# Patient Record
Sex: Female | Born: 1970 | Race: White | Hispanic: No | Smoking: Never smoker
Health system: Southern US, Community
[De-identification: ages and names within clinical notes are randomized; demographics above are authoritative.]

## PROBLEM LIST (undated history)

## (undated) DIAGNOSIS — E039 Hypothyroidism, unspecified: Secondary | ICD-10-CM

## (undated) DIAGNOSIS — C801 Malignant (primary) neoplasm, unspecified: Secondary | ICD-10-CM

## (undated) DIAGNOSIS — E079 Disorder of thyroid, unspecified: Secondary | ICD-10-CM

## (undated) DIAGNOSIS — I10 Essential (primary) hypertension: Secondary | ICD-10-CM

## (undated) HISTORY — DX: Hypothyroidism, unspecified: E03.9

## (undated) HISTORY — PX: COLONOSCOPY: SHX174

## (undated) HISTORY — PX: FINE NEEDLE ASPIRATION: SHX406

## (undated) HISTORY — DX: Disorder of thyroid, unspecified: E07.9

## (undated) HISTORY — DX: Essential (primary) hypertension: I10

---
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
## 2005-09-04 ENCOUNTER — Ambulatory Visit: Payer: Self-pay

## 2007-08-01 ENCOUNTER — Observation Stay: Payer: Self-pay | Admitting: Obstetrics and Gynecology

## 2007-08-25 ENCOUNTER — Inpatient Hospital Stay: Payer: Self-pay

## 2011-07-01 ENCOUNTER — Ambulatory Visit: Payer: Self-pay

## 2012-08-04 ENCOUNTER — Ambulatory Visit: Payer: Self-pay

## 2012-08-17 ENCOUNTER — Ambulatory Visit: Payer: Self-pay

## 2013-08-31 ENCOUNTER — Ambulatory Visit: Payer: Self-pay

## 2013-09-05 ENCOUNTER — Ambulatory Visit: Payer: Self-pay

## 2013-09-07 ENCOUNTER — Ambulatory Visit: Payer: Self-pay

## 2013-09-08 LAB — PATHOLOGY REPORT

## 2013-09-21 ENCOUNTER — Encounter: Payer: Self-pay | Admitting: General Surgery

## 2013-10-18 ENCOUNTER — Encounter: Payer: Self-pay | Admitting: General Surgery

## 2013-10-18 ENCOUNTER — Other Ambulatory Visit: Payer: Self-pay | Admitting: General Surgery

## 2013-10-18 ENCOUNTER — Other Ambulatory Visit: Payer: BC Managed Care – PPO

## 2013-10-18 ENCOUNTER — Ambulatory Visit (INDEPENDENT_AMBULATORY_CARE_PROVIDER_SITE_OTHER): Payer: BC Managed Care – PPO | Admitting: General Surgery

## 2013-10-18 VITALS — BP 124/72 | HR 78 | Resp 12 | Ht 68.0 in | Wt 176.0 lb

## 2013-10-18 DIAGNOSIS — R928 Other abnormal and inconclusive findings on diagnostic imaging of breast: Secondary | ICD-10-CM

## 2013-10-18 DIAGNOSIS — N63 Unspecified lump in unspecified breast: Secondary | ICD-10-CM

## 2013-10-18 NOTE — Patient Instructions (Addendum)
Patient to be scheduled for excision of left breast. The patient is aware to call back for any questions or concerns.  Patient is scheduled for surgery at [facility] on 10/31/13. She will pre admit by phone on 10/20/13. Patient is aware of date and instructions.

## 2013-10-18 NOTE — Progress Notes (Signed)
Patient ID: Yolanda Gill, female   DOB: November 13, 1970, 43 y.o.   MRN: [id number]  Chief Complaint  Patient presents with  . Other    mammogram    HPI Yolanda Gill is a 43 y.o. female. who presents for a breast evaluation. The patient underwent screening mammograms on August 31, 2013. Some distortion on the left oriented additional views. BI-RAD-0. The most recent mammogram and ultrasound  was done on 09/05/13. She also had a left breast biopsy done on 09/07/13. Patient does perform regular self breast checks and gets regular mammograms done. No known family history of breast problems. The patient denies any problems with the breasts at this time.  She had additional views and ultrasound last year on the right breast. No injuries to the breasts.    HPI  Past Medical History  Diagnosis Date  . Thyroid disease   . Hypothyroidism     Past Surgical History  Procedure Laterality Date  . Wisdom tooth extraction  1990  . Fine needle aspiration Left     when she was in college - left breast    No family history on file.  Social History History  Substance Use Topics  . Smoking status: Never Smoker   . Smokeless tobacco: Never Used  . Alcohol Use: No    No Known Allergies  Current Outpatient Prescriptions  Medication Sig Dispense Refill  . fluticasone (FLONASE) 50 MCG/ACT nasal spray Place 1 spray into both nostrils daily.      Marland Kitchen levothyroxine (SYNTHROID, LEVOTHROID) 75 MCG tablet Take 1 tablet by mouth daily.       No current facility-administered medications for this visit.    Review of Systems Review of Systems  Constitutional: Negative.   Respiratory: Negative.   Cardiovascular: Negative.     Blood pressure 124/72, pulse 78, resp. rate 12, height 5\' 8"  (1.727 m), weight 176 lb (79.833 kg), last menstrual period 10/03/2013.  Physical Exam Physical Exam  Constitutional: She is oriented to person, place, and time. She appears well-developed and well-nourished.  Neck: Neck supple.  No thyromegaly present.  Cardiovascular: Normal rate, regular rhythm and normal heart sounds.   No murmur heard. Pulmonary/Chest: Effort normal and breath sounds normal. Right breast exhibits no inverted nipple, no mass, no nipple discharge, no skin change and no tenderness. Left breast exhibits no inverted nipple, no mass, no nipple discharge, no skin change and no tenderness.  Lymphadenopathy:    She has no cervical adenopathy.    She has no axillary adenopathy.  Neurological: She is alert and oriented to person, place, and time.  Skin: Skin is warm and dry.    Data Reviewed Additional views dated Sep 05, 2013 showed heterogeneously dense tissue. Distortion in the upper inner quadrant left breast was reported. Ultrasound suggested a solitary distortion 4 cm from the nipple with associated acoustic shadowing.. Core biopsy completed Sep 07, 2013 was completed by the radiology service. A 12-gauge vacuum device was used and 3 samples obtained. Postbiopsy mammogram reported that the biopsy marker was 2 cm medial and anterior to the area of distortion that was the area of concern.   Pathology dated Sep 07, 2013 showed features consistent with radial scar. No atypia or malignancy. Each of 3 cores reportedly contained areas of elastosis, fibrosis and entrapped epithelial elements. These areas measured 2, 2 and 4 mm in diameter. Columnar cell change in clusters of apocrine microcysts were also identified.  Pre-and post biopsy mammograms were reviewed and compared to prior studies.  Ultrasound examination of the left breast in the upper inner quadrant were completed. Images are incorrectly labeled as right. At the 11 o'clock position of the left breast, 4 cm from nipple an area of architectural distortion and a clip are identified measuring 0.8 x 0.96 x 0.97 cm. This correlates with a postbiopsy film showing a rounded density medial to the marked area of radiologic concern.  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     Plan    There is unfortunately a discordance between where the biopsy was reportedly completed, where postbiopsy changes are seen on the post ultrasound-guided mammograms and clip placement and the microscopic findings the radial scar. Radial scars less than 10 mm in diameter required no additional surgical intervention. As it's unclear of what area and the breast was actually biopsied, options were reviewed: 1) open excision of the area versus 2) 6 month followup mammograms for 2 years to confirm stability. Considering the distortion evidence on the postbiopsy mammograms it may be difficult to "sort the wheat from the chaff".   The risks of open biopsy were discussed. Bleeding, infection, pain and distortion of the breast and we'll were reviewed. At this time it seems prudent to proceed to excision.    Patient is scheduled for surgery at [facility] on 10/31/13. She will pre admit by phone on 10/20/13. Patient is aware of date and instructions.  PCP and Ref MD: Eddie Dibbles 10/18/2013, 7:30 PM

## 2013-10-19 ENCOUNTER — Telehealth: Payer: Self-pay

## 2013-10-19 NOTE — Telephone Encounter (Signed)
Spoke with patient about upcoming surgery on 10/31/13 and let her know she would also be getting a needle wire location done in mammography. Patient is to report to mammography at [facility] on 10/31/13 at 8:15 am. Patient is aware of date, time, and instructions.

## 2013-10-20 ENCOUNTER — Ambulatory Visit: Payer: Self-pay | Admitting: General Surgery

## 2013-10-31 ENCOUNTER — Encounter: Payer: Self-pay | Admitting: General Surgery

## 2013-10-31 ENCOUNTER — Ambulatory Visit: Payer: Self-pay | Admitting: General Surgery

## 2013-10-31 DIAGNOSIS — D249 Benign neoplasm of unspecified breast: Secondary | ICD-10-CM

## 2013-10-31 HISTORY — PX: BREAST SURGERY: SHX581

## 2013-11-01 ENCOUNTER — Encounter: Payer: Self-pay | Admitting: General Surgery

## 2013-11-02 ENCOUNTER — Telehealth: Payer: Self-pay | Admitting: *Deleted

## 2013-11-02 LAB — PATHOLOGY REPORT

## 2013-11-02 NOTE — Telephone Encounter (Signed)
Notified patient as instructed, benign breast biopsy results, patient pleased. Dr. Bary Castilla will go into more details at her office visit. She states she is doing well. Discussed follow-up appointments, patient agrees.

## 2013-11-04 ENCOUNTER — Encounter: Payer: Self-pay | Admitting: General Surgery

## 2013-11-07 ENCOUNTER — Encounter: Payer: Self-pay | Admitting: General Surgery

## 2013-11-07 ENCOUNTER — Ambulatory Visit (INDEPENDENT_AMBULATORY_CARE_PROVIDER_SITE_OTHER): Payer: Self-pay | Admitting: General Surgery

## 2013-11-07 VITALS — BP 120/78 | HR 72 | Resp 14 | Ht 68.0 in | Wt 176.0 lb

## 2013-11-07 DIAGNOSIS — N63 Unspecified lump in unspecified breast: Secondary | ICD-10-CM

## 2013-11-07 DIAGNOSIS — N6489 Other specified disorders of breast: Secondary | ICD-10-CM | POA: Insufficient documentation

## 2013-11-07 NOTE — Progress Notes (Signed)
Patient ID: Yolanda Gill, female   DOB: [birth year], 43 y.o.   MRN: [id number]  Chief Complaint  Patient presents with  . Follow-up    post op left breast excision     HPI Yolanda Gill is a 43 y.o. female who presents for a post op excision of left breast mass. The procedure was performed on 10/31/13. Needle localization was completed to confirm the area of mammographic abnormality was included in the reexcision of the radial scar identified on ultrasound guided biopsy.  No new complaints at this time.   HPI  Past Medical History  Diagnosis Date  . Thyroid disease   . Hypothyroidism     Past Surgical History  Procedure Laterality Date  . Wisdom tooth extraction  1990  . Fine needle aspiration Left     when she was in college - left breast  . Breast surgery Left October 31, 2013    Radial scar without atypia    No family history on file.  Social History History  Substance Use Topics  . Smoking status: Never Smoker   . Smokeless tobacco: Never Used  . Alcohol Use: No    No Known Allergies  Current Outpatient Prescriptions  Medication Sig Dispense Refill  . fluticasone (FLONASE) 50 MCG/ACT nasal spray Place 1 spray into both nostrils daily.      Marland Kitchen levothyroxine (SYNTHROID, LEVOTHROID) 75 MCG tablet Take 1 tablet by mouth daily.       No current facility-administered medications for this visit.    Review of Systems Review of Systems  Constitutional: Negative.   Respiratory: Negative.   Cardiovascular: Negative.     Blood pressure 120/78, pulse 72, resp. rate 14, height 5\' 8"  (1.727 m), weight 176 lb (79.833 kg), last menstrual period 10/24/2013.  Physical Exam Physical Exam  Constitutional: She is oriented to person, place, and time. She appears well-developed and well-nourished.  Pulmonary/Chest:    Left breast excision healing well.   Neurological: She is alert and oriented to person, place, and time.  Skin: Skin is warm and dry.    Data Reviewed Radial scar  without atypia  Assessment    Benign breast exam post excision.     Plan    The patient expressed an interest in having her followup mammographic studies completed at another facility. Her which will be accommodated. A left breast mammogram will be obtained in 5 months at [facility].     PCP: Eddie Dibbles 11/07/2013, 10:28 PM

## 2013-11-07 NOTE — Patient Instructions (Signed)
Patient to return in 5 months with left breast mammogram. Continue self breast exams. Call office for any new breast issues or concerns.

## 2014-03-06 ENCOUNTER — Encounter: Payer: Self-pay | Admitting: General Surgery

## 2014-04-12 ENCOUNTER — Ambulatory Visit: Payer: BC Managed Care – PPO | Admitting: General Surgery

## 2014-05-02 ENCOUNTER — Encounter: Payer: Self-pay | Admitting: *Deleted

## 2014-05-09 ENCOUNTER — Encounter: Payer: Self-pay | Admitting: General Surgery

## 2014-08-26 NOTE — Op Note (Signed)
PATIENT NAME:  Yolanda Gill, Yolanda Gill MR#:  [id number] DATE OF BIRTH:  1971/04/26  DATE OF PROCEDURE:  10/31/2013  PREOPERATIVE DIAGNOSIS: Abnormal left breast mammogram.   POSTOPERATIVE DIAGNOSIS: Abnormal left breast mammogram.   OPERATIVE PROCEDURE: Wire localization and excision of left breast mass.   SURGEON: Hervey Ard, MD   ANESTHESIA: General by LMA, Marcaine 0.5% with 1:200,000 units epinephrine 30 mL local infiltration.   CLINICAL NOTE: This 44 year old woman had previously undergone an ultrasound-guided biopsy with evidence of radial scar. The radiologist reported discordance between the mammogram and ultrasound imaging and for that reason the patient was felt to be a candidate for excisional biopsy.   OPERATIVE NOTE: With the patient under adequate general anesthesia, the breast was carefully prepped with ChloraPrep to prevent disturbance of the previously placed localizing wire. The radiologist had been contacted the morning of the procedure to discuss the need to localize the mammographic abnormality and this was accomplished.   Marcaine was infiltrated for postoperative analgesia. An incision was made from the 10 to the 1 o'clock position of the breast, in a curvilinear fashion, and the skin was incised sharply. The remaining dissection was completed with electrocautery. The localizing wire was brought into the field after identification with ultrasound. The previous biopsy site was identified. A 3 x 3 x 3 cm block of tissue was excised, orientated and sent for specimen radiograph. This confirmed the previously placed biopsy clip as well as the entire hook of the wire. This was then sent for routine histology. The wound was closed in layers with 2-0 Vicryl figure-of-eight sutures. The skin was closed with a running 4-0 Vicryl subcuticular suture. Benzoin and Steri-Strips followed by a Telfa dressing were applied. Fluff gauze, Kerlix and an Ace wrap were then applied.   The patient  tolerated the procedure well and was taken to the recovery room in stable condition.  ____________________________ Robert Bellow, MD jwb:sb D: 11/01/2013 08:11:58 ET T: 11/01/2013 08:33:11 ET JOB#: [id number]  cc: Robert Bellow, MD, <Dictator> Flossie Dibble, CNM JEFFREY Amedeo Kinsman MD ELECTRONICALLY SIGNED 11/01/2013 18:49

## 2014-11-07 ENCOUNTER — Encounter: Payer: Self-pay | Admitting: General Surgery

## 2014-11-09 ENCOUNTER — Ambulatory Visit (INDEPENDENT_AMBULATORY_CARE_PROVIDER_SITE_OTHER): Payer: BLUE CROSS/BLUE SHIELD | Admitting: General Surgery

## 2014-11-09 ENCOUNTER — Encounter: Payer: Self-pay | Admitting: General Surgery

## 2014-11-09 VITALS — BP 106/74 | HR 80 | Resp 14 | Ht 68.0 in | Wt 181.0 lb

## 2014-11-09 DIAGNOSIS — N6489 Other specified disorders of breast: Secondary | ICD-10-CM

## 2014-11-09 NOTE — Patient Instructions (Signed)
Continue self breast exams. Call office for any new breast issues or concerns. 

## 2014-11-09 NOTE — Progress Notes (Signed)
Patient ID: Yolanda Gill, female   DOB: 08/14/1970, 44 y.o.   MRN: [id number]  Chief Complaint  Patient presents with  . Follow-up    HPI Yolanda Gill is a 44 y.o. female.  who presents for a breast evaluation. The most recent mammogram was done on 11-03-14.  Patient does perform regular self breast checks and gets regular mammograms done. No new breast complaints.   HPI  Past Medical History  Diagnosis Date  . Thyroid disease   . Hypothyroidism     Past Surgical History  Procedure Laterality Date  . Wisdom tooth extraction  1990  . Fine needle aspiration Left     when she was in college - left breast  . Breast surgery Left October 31, 2013    Radial scar without atypia    No family history on file.  Social History History  Substance Use Topics  . Smoking status: Never Smoker   . Smokeless tobacco: Never Used  . Alcohol Use: No    No Known Allergies  Current Outpatient Prescriptions  Medication Sig Dispense Refill  . fluticasone (FLONASE) 50 MCG/ACT nasal spray Place 1 spray into both nostrils daily.    Marland Kitchen levothyroxine (SYNTHROID, LEVOTHROID) 75 MCG tablet Take 1 tablet by mouth daily.     No current facility-administered medications for this visit.    Review of Systems Review of Systems  Constitutional: Negative.   Respiratory: Negative.   Cardiovascular: Negative.     Blood pressure 106/74, pulse 80, resp. rate 14, height 5\' 8"  (1.727 m), weight 181 lb (82.101 kg), last menstrual period 11/02/2014.  Physical Exam Physical Exam  Constitutional: She is oriented to person, place, and time. She appears well-developed and well-nourished.  HENT:  Mouth/Throat: Oropharynx is clear and moist.  Eyes: Conjunctivae are normal. No scleral icterus.  Neck: Neck supple.  Cardiovascular: Normal rate, regular rhythm and normal heart sounds.   Pulmonary/Chest: Effort normal and breath sounds normal. Right breast exhibits no inverted nipple, no mass, no nipple discharge, no  skin change and no tenderness. Left breast exhibits no inverted nipple, no mass, no nipple discharge, no skin change and no tenderness.  Lymphadenopathy:    She has no cervical adenopathy.    She has no axillary adenopathy.  Neurological: She is alert and oriented to person, place, and time.  Skin: Skin is warm and dry.  Psychiatric: She has a normal mood and affect.    Data Reviewed Bilateral diagnostic mammograms dated 11/03/2014 showed postsurgical changes in the left breast.  BI-RADS-2.  Assessment    Benign breast exam.    Plan    Final follow-up in one year with bilateral mammogram.    Patient will be asked to return to the office in one year with a bilateral screening mammogram. Continue self breast exams. Call office for any new breast issues or concerns.  The patient reports that she was looking to change GYN provider. She was encouraged to touch base with the staff at [facility].  PCP:  No Pcp Per Patient  Carson Myrtle 11/09/2014, 10:46 AM

## 2015-11-08 ENCOUNTER — Encounter: Payer: Self-pay | Admitting: General Surgery

## 2015-11-15 ENCOUNTER — Ambulatory Visit: Payer: BLUE CROSS/BLUE SHIELD | Admitting: General Surgery

## 2015-11-30 ENCOUNTER — Encounter: Payer: Self-pay | Admitting: General Surgery

## 2015-12-11 ENCOUNTER — Encounter: Payer: Self-pay | Admitting: *Deleted

## 2015-12-18 ENCOUNTER — Ambulatory Visit (INDEPENDENT_AMBULATORY_CARE_PROVIDER_SITE_OTHER): Payer: BLUE CROSS/BLUE SHIELD | Admitting: General Surgery

## 2015-12-18 ENCOUNTER — Encounter: Payer: Self-pay | Admitting: General Surgery

## 2015-12-18 VITALS — BP 124/68 | HR 81 | Resp 12 | Ht 68.0 in | Wt 189.0 lb

## 2015-12-18 DIAGNOSIS — N6489 Other specified disorders of breast: Secondary | ICD-10-CM | POA: Diagnosis not present

## 2015-12-18 NOTE — Patient Instructions (Addendum)
Continue self breast exams. Call office for any new breast issues or concerns. 

## 2015-12-18 NOTE — Progress Notes (Addendum)
Patient ID: Yolanda Gill, female   DOB: 1970-06-25, 45 y.o.   MRN: [id number]  Chief Complaint  Patient presents with  . Follow-up    mammogram    HPI Yolanda Gill is a 45 y.o. female.  who presents for a breast evaluation. The most recent mammogram was done on 11-07-15 with left mammogram 11-28-15.  Patient does perform regular self breast checks and gets regular mammograms done. No new breast changes. Patient reports her Maternal Aunt was diagnosed with breast cancer recently.     HPI  Past Medical History:  Diagnosis Date  . Hypothyroidism   . Thyroid disease     Past Surgical History:  Procedure Laterality Date  . BREAST SURGERY Left October 31, 2013   Radial scar without atypia  . FINE NEEDLE ASPIRATION Left    when she was in college - left breast  . WISDOM TOOTH EXTRACTION  1990    Family History  Problem Relation Age of Onset  . Cancer Maternal Aunt     Breast Cancer     Social History Social History  Substance Use Topics  . Smoking status: Never Smoker  . Smokeless tobacco: Never Used  . Alcohol use No    No Known Allergies  Current Outpatient Prescriptions  Medication Sig Dispense Refill  . levothyroxine (SYNTHROID, LEVOTHROID) 75 MCG tablet Take 1 tablet by mouth daily.     No current facility-administered medications for this visit.     Review of Systems Review of Systems  Constitutional: Negative.   Respiratory: Negative.   Cardiovascular: Negative.     Blood pressure 124/68, pulse 81, resp. rate 12, height 5\' 8"  (1.727 m), weight 189 lb (85.7 kg).  Physical Exam Physical Exam  Constitutional: She is oriented to person, place, and time. She appears well-developed and well-nourished.  Eyes: Conjunctivae are normal. No scleral icterus.  Neck: Neck supple.  Cardiovascular: Normal rate, regular rhythm and normal heart sounds.   Pulmonary/Chest: Effort normal and breath sounds normal. Right breast exhibits no inverted nipple, no mass, no nipple  discharge, no skin change and no tenderness. Left breast exhibits no inverted nipple, no mass, no nipple discharge, no skin change and no tenderness.    Lymphadenopathy:    She has no cervical adenopathy.    She has no axillary adenopathy.  Neurological: She is alert and oriented to person, place, and time.  Skin: Skin is warm and dry.    Data Reviewed Lateral screening mammograms dated 11/07/2015 completed at [facility] reviewed. Mild architectural distortion in the area of previous excision. BIRAD-0.  Focal spot compression views dated like 28, 2017 were unremarkable. BI-RADS-1.  Assessment    Unremarkable breast exam.    Plan    Patient to follow up with primary doctor to have routine mammograms yearly.        PCP: Dewain Penning 12/19/2015, 8:08 PM

## 2016-03-19 IMAGING — US US BIOPSY BREAST CORE W/ IMAGING
1 series · 13 of 14 positions shown · non-contrast
Comparison: Previous exams.

ADDENDUM:
The final pathological diagnosis is radial scar. This is concordant
with the imaging findings. Due to the association of radial scar
with malignancy, surgical excision is recommended.

The final pathological diagnosis and recommendation were discussed
with the patient by telephone on 09/09/2013. Her questions were
answered. She reported two quarter sized areas of bruising at the
biopsy site with no pain or palpable hematoma.
CLINICAL DATA: Patient presents for ultrasound-guided core biopsy
of distortion in the left breast.
EXAM:
ULTRASOUND GUIDED LEFT BREAST CORE NEEDLE BIOPSY WITH VACUUM ASSIST

[Series 1: us biopsy breast core w/ imaging · 0.08mm/px · 13 of 14 slices shown]
[im 1/14]
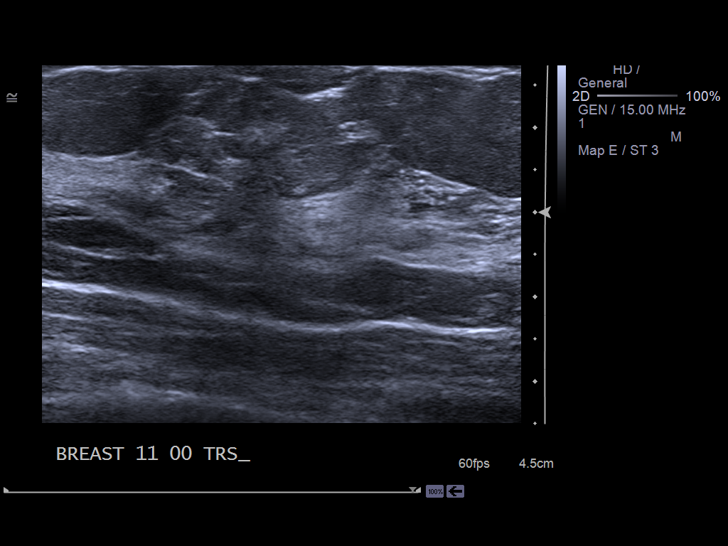
[im 2/14]
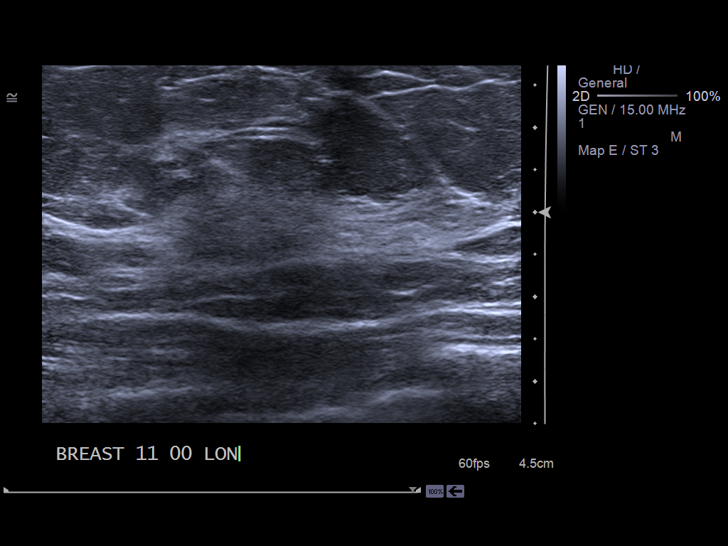
[im 3/14]
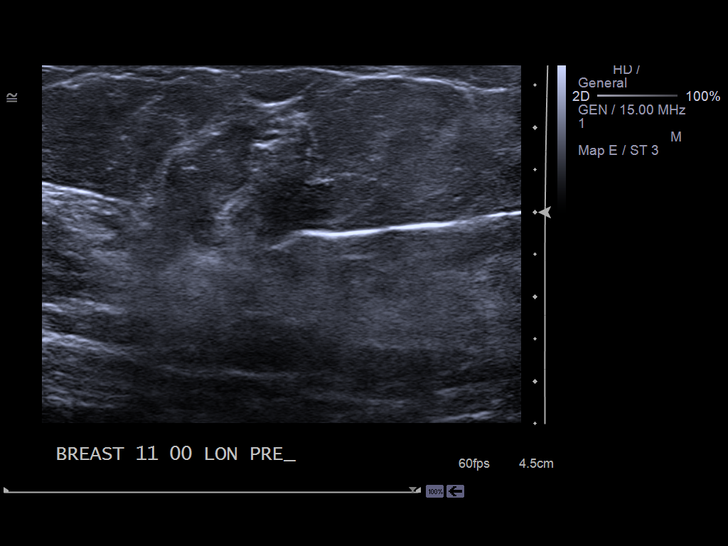
[im 4/14]
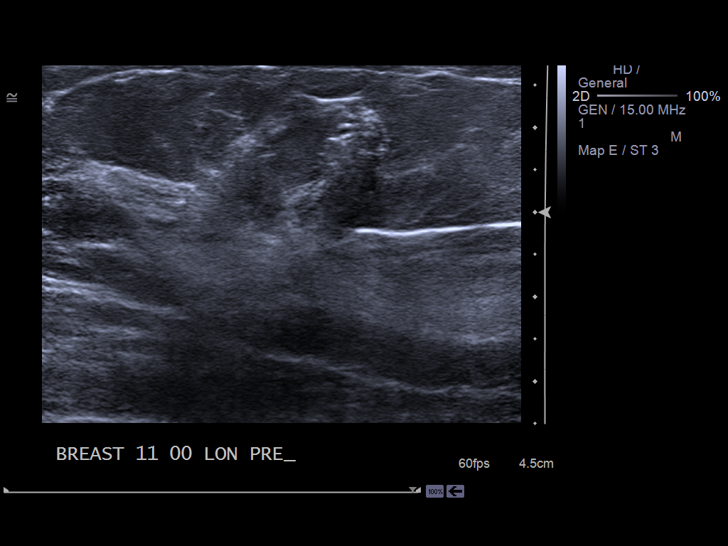
[im 5/14]
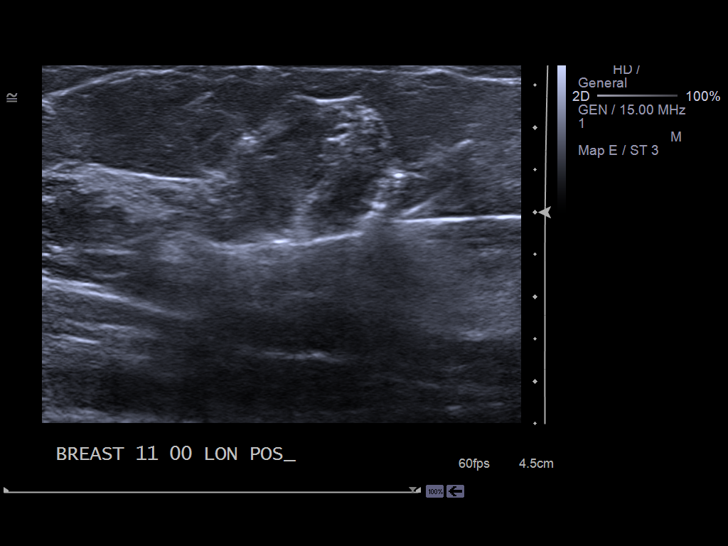
[im 6/14]
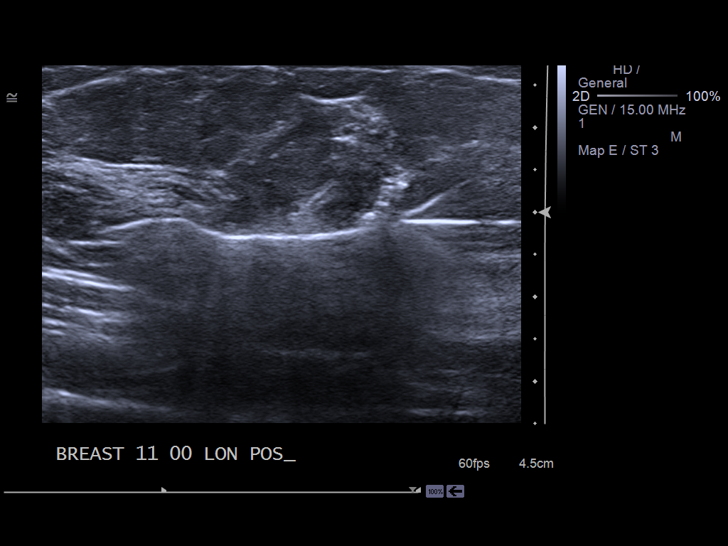
[im 8/14]
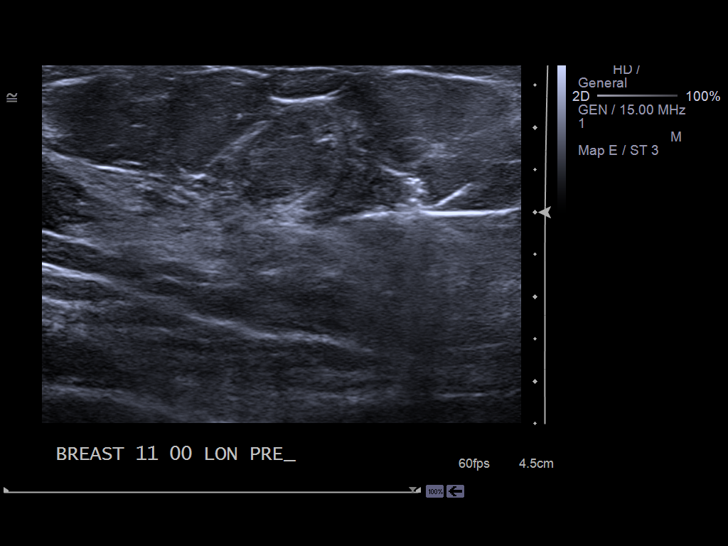
[im 9/14]
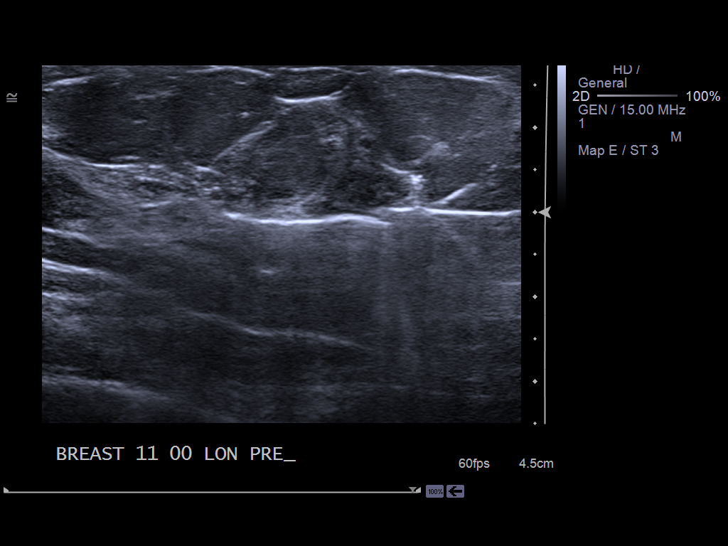
[im 10/14]
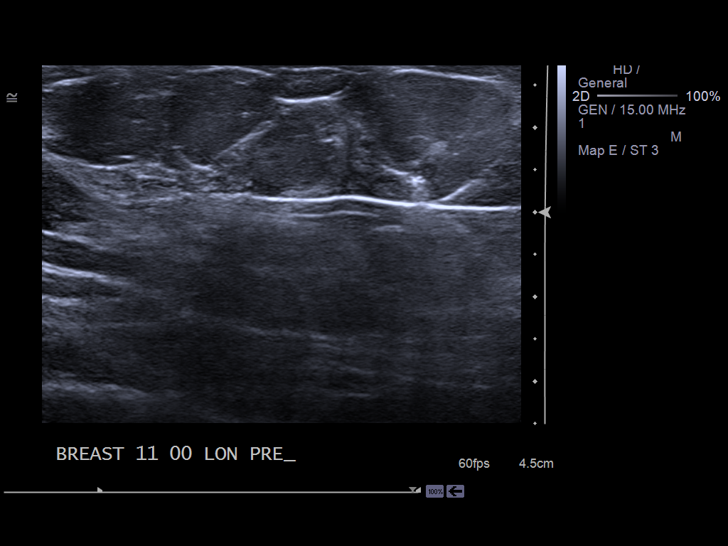
[im 11/14]
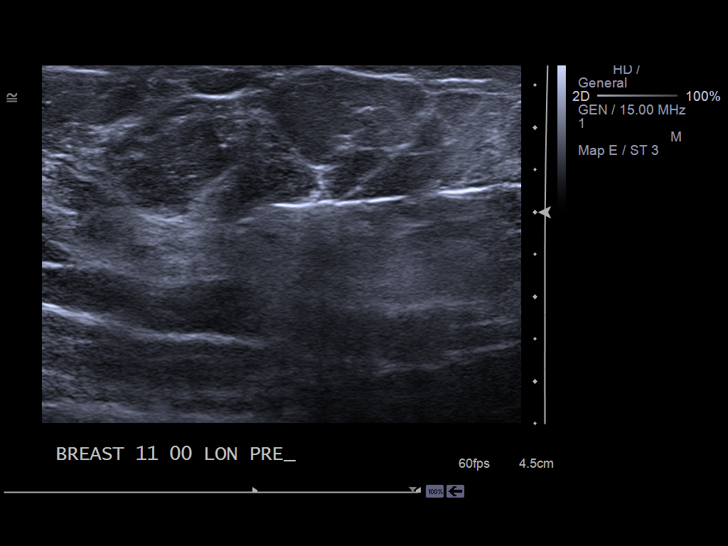
[im 12/14]
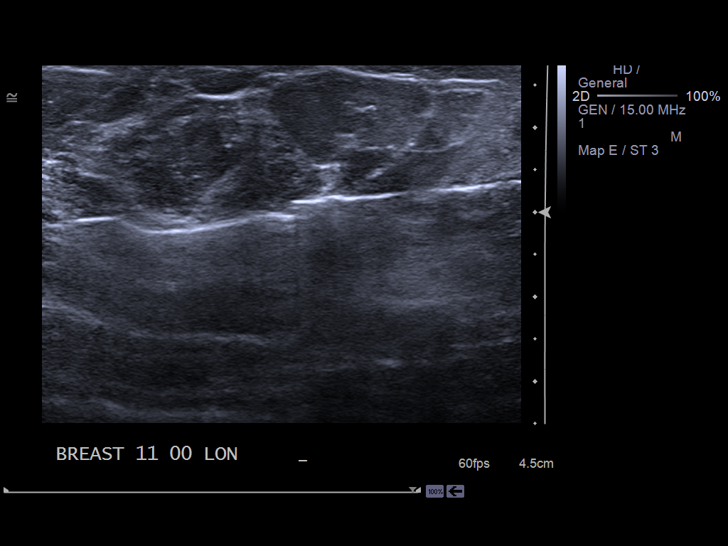
[im 13/14]
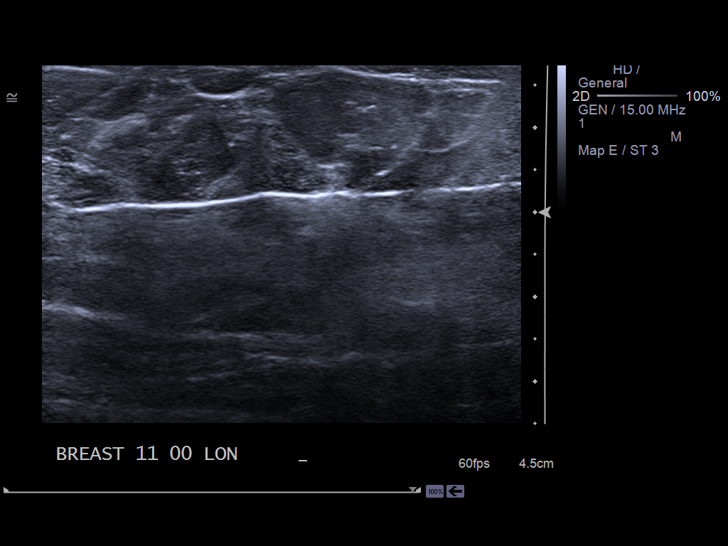
[im 14/14]
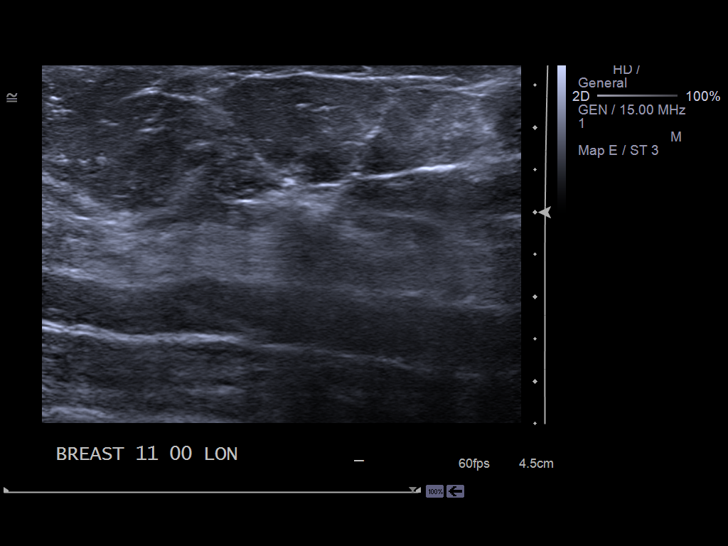

[13 of 14 positions shown; findings below may reference images not displayed]

PROCEDURE:
I met with the patient and we discussed the procedure of
ultrasound-guided biopsy, including benefits and alternatives. We
discussed the high likelihood of a successful procedure. We
discussed the risks of the procedure including infection, bleeding,
tissue injury, clip migration, and inadequate sampling. Informed
written consent was given. The usual time-out protocol was performed
immediately prior to the procedure.

Using sterile technique and 2% Lidocaine as local anesthetic, under
direct ultrasound visualization, a 12 gauge vacuum-assisteddevice
was used to perform biopsy of distortion in the 11 o'clock location
of the left breast using a lateral approach. At the conclusion of
the procedure, a coil shaped tissue marker clip was deployed into
the biopsy cavity. Follow-up 2-view mammogram was performed and
dictated separately.
IMPRESSION: Ultrasound-guided biopsy of left breast distortion. No apparent
complications.

## 2019-05-04 ENCOUNTER — Ambulatory Visit: Payer: BLUE CROSS/BLUE SHIELD | Attending: Internal Medicine

## 2019-05-04 DIAGNOSIS — Z20822 Contact with and (suspected) exposure to covid-19: Secondary | ICD-10-CM

## 2019-05-06 LAB — NOVEL CORONAVIRUS, NAA: SARS-CoV-2, NAA: NOT DETECTED

## 2022-08-04 HISTORY — PX: BREAST BIOPSY: SHX20

## 2022-09-09 ENCOUNTER — Inpatient Hospital Stay: Payer: 59 | Attending: Internal Medicine | Admitting: Internal Medicine

## 2022-09-09 ENCOUNTER — Encounter: Payer: Self-pay | Admitting: Internal Medicine

## 2022-09-09 ENCOUNTER — Inpatient Hospital Stay: Payer: 59

## 2022-09-09 VITALS — BP 127/77 | HR 79 | Temp 96.9°F | Ht 68.0 in | Wt 168.8 lb

## 2022-09-09 DIAGNOSIS — R928 Other abnormal and inconclusive findings on diagnostic imaging of breast: Secondary | ICD-10-CM | POA: Diagnosis present

## 2022-09-09 DIAGNOSIS — Z79899 Other long term (current) drug therapy: Secondary | ICD-10-CM | POA: Insufficient documentation

## 2022-09-09 DIAGNOSIS — Z9189 Other specified personal risk factors, not elsewhere classified: Secondary | ICD-10-CM

## 2022-09-09 DIAGNOSIS — I10 Essential (primary) hypertension: Secondary | ICD-10-CM

## 2022-09-09 DIAGNOSIS — E039 Hypothyroidism, unspecified: Secondary | ICD-10-CM | POA: Diagnosis not present

## 2022-09-09 DIAGNOSIS — Z803 Family history of malignant neoplasm of breast: Secondary | ICD-10-CM | POA: Insufficient documentation

## 2022-09-09 DIAGNOSIS — Z79890 Hormone replacement therapy (postmenopausal): Secondary | ICD-10-CM | POA: Insufficient documentation

## 2022-09-09 NOTE — Progress Notes (Signed)
Doesn't understand why she's really here today.

## 2022-09-09 NOTE — Assessment & Plan Note (Addendum)
#   Based on IBIS calculator patient's risk calculated-lifetime risk is around 30%; and risk over the next 10 years- 11%.  In general population lifetime risk is about 9%; and 10-year risk-2 to 3%.  Using endocrine therapy-the risk is almost cut down by 50%.  However, using any therapeutic intervention has not shown to have improved breast cancer specific survival or overall survival.  # Discussed the role of tamoxifen in risk reduction of development of breast cancer.  I reviewed the data of using tamoxifen 20 mg a day for 5 years.  Long discussion regarding the potential adverse events on tamoxifen including but not limited to hot flashes, mood swings, thromboembolic events strokes and also small risk of uterine cancers.  After lengthy discussion with patient feels the risk of the treatment overweigh the benefits.  And declines any endocrine therapy; which I think is very reasonable.   #Had a long discussion with the patient regarding incorporation of healthy lifestyle which includes #moderation of alcohol #abstinence of smoking #maintaining healthy BMI#exercise 150-300 minutes of moderate intensity exercise/week.  Patient does not smoke.   # I also reviewed cancer screening: a] colonoscopy at 50 years- WNL as per patient.  b] annual PAP smear-WNL.   # GENETICS: Given patient's lack of significant family history-patient would not be a candidate for any genetic testing at this time.  Hold off any genetic referral.  Thank you, Dr.Cintron MD or allowing me to participate in the care of your pleasant patient. Please do not hesitate to contact me with questions or concerns in the interim.   # DISPOSITION: # print out # no labs-  # follow up as needed- Dr.B

## 2022-09-09 NOTE — Progress Notes (Signed)
[facility] CONSULT NOTE  Patient Care Team: Patient, No Pcp Per as PCP - General (General Practice) Schermerhorn, Ihor Austin, MD as Referring Physician (Obstetrics and Gynecology) Lemar Livings Merrily Pew, MD (General Surgery)  CHIEF COMPLAINTS/PURPOSE OF CONSULTATION: high risk for Breast cancer  #  Oncology History   No history exists.   A: Breast, left, core biopsy -Atypical lobular hyperplasia with microcalcifications -Fibroadenoma with microcalcifications -Microcalcifications in unremarkable lobular units   # Left Breast Biopsy s/p Lumpectomy [DrByrnett- 2015]  HISTORY OF PRESENTING ILLNESS: Patient ambulating-independently.  Alone.  Yolanda Gill 52 y.o.  female female with no prior history of breast cancer/recently evaluated with surgery for abnormal mammogram/biopsy noted to have elevated risk of breast cancer; referred to [place] for further evaluation recommendations.  Patient had a previous biopsy in 2015/followed by lumpectomy negative for any malignancy.  More recently April 2024-[facility] mammogram-focal abnormality similar to biopsy showed atypical lobular hyperplasia; fibroadenoma.  Patient evaluated surgery and recommended further consideration of risk reduction strategies.  I reviewed the patient's IBIS model risk categorization in detail.  Review of Systems  Constitutional:  Negative for chills, diaphoresis, fever, malaise/fatigue and weight loss.  HENT:  Negative for nosebleeds and sore throat.   Eyes:  Negative for double vision.  Respiratory:  Negative for cough, hemoptysis, sputum production, shortness of breath and wheezing.   Cardiovascular:  Negative for chest pain, palpitations, orthopnea and leg swelling.  Gastrointestinal:  Negative for abdominal pain, blood in stool, constipation, diarrhea, heartburn, melena, nausea and vomiting.  Genitourinary:  Negative for dysuria, frequency and urgency.  Musculoskeletal:  Negative for back pain and joint pain.   Skin: Negative.  Negative for itching and rash.  Neurological:  Negative for dizziness, tingling, focal weakness, weakness and headaches.  Endo/Heme/Allergies:  Does not bruise/bleed easily.  Psychiatric/Behavioral:  Negative for depression. The patient is not nervous/anxious and does not have insomnia.      MEDICAL HISTORY:  Past Medical History:  Diagnosis Date   Hypertension    Hypothyroidism    Thyroid disease     SURGICAL HISTORY: Past Surgical History:  Procedure Laterality Date   BREAST SURGERY Left October 31, 2013   Radial scar without atypia   FINE NEEDLE ASPIRATION Left    when she was in college - left breast   WISDOM TOOTH EXTRACTION  1990    SOCIAL HISTORY: Social History   Socioeconomic History   Marital status: Married    Spouse name: Not on file   Number of children: Not on file   Years of education: Not on file   Highest education level: Not on file  Occupational History   Not on file  Tobacco Use   Smoking status: Never   Smokeless tobacco: Never  Substance and Sexual Activity   Alcohol use: No   Drug use: No   Sexual activity: Not on file  Other Topics Concern   Not on file  Social History Narrative   Not on file   Social Determinants of Health   Financial Resource Strain: Not on file  Food Insecurity: Not on file  Transportation Needs: Not on file  Physical Activity: Not on file  Stress: Not on file  Social Connections: Not on file  Intimate Partner Violence: Not on file    FAMILY HISTORY: Family History  Problem Relation Age of Onset   Hypertension Mother    Thyroid disease Mother    Hypertension Father    Cancer Maternal Aunt  Breast Cancer    ALLERGIES:  has No Known Allergies.  MEDICATIONS:  Current Outpatient Medications  Medication Sig Dispense Refill   cetirizine (ZYRTEC) 10 MG tablet Take by mouth.     levothyroxine (SYNTHROID, LEVOTHROID) 75 MCG tablet Take 1 tablet by mouth daily.      losartan-hydrochlorothiazide (HYZAAR) 100-12.5 MG tablet Take 1 tablet by mouth daily.     vitamin B-12 (CYANOCOBALAMIN) 100 MCG tablet Take 100 mcg by mouth daily.     No current facility-administered medications for this visit.    PHYSICAL EXAMINATION: ECOG PERFORMANCE STATUS: 0 - Asymptomatic  Vitals:   09/09/22 1112  BP: 127/77  Pulse: 79  Temp: (!) 96.9 F (36.1 C)  SpO2: 100%   Filed Weights   09/09/22 1112  Weight: 168 lb 12.8 oz (76.6 kg)    Physical Exam Vitals and nursing note reviewed.  HENT:     Head: Normocephalic and atraumatic.     Mouth/Throat:     Pharynx: Oropharynx is clear.  Eyes:     Extraocular Movements: Extraocular movements intact.     Pupils: Pupils are equal, round, and reactive to light.  Cardiovascular:     Rate and Rhythm: Normal rate and regular rhythm.  Pulmonary:     Comments: Decreased breath sounds bilaterally.  Abdominal:     Palpations: Abdomen is soft.  Musculoskeletal:        General: Normal range of motion.     Cervical back: Normal range of motion.  Skin:    General: Skin is warm.  Neurological:     General: No focal deficit present.     Mental Status: She is alert and oriented to person, place, and time.  Psychiatric:        Behavior: Behavior normal.        Judgment: Judgment normal.      LABORATORY DATA:  I have reviewed the data as listed No results found for: "WBC", "HGB", "HCT", "MCV", "PLT" No results for input(s): "NA", "K", "CL", "CO2", "GLUCOSE", "BUN", "CREATININE", "CALCIUM", "GFRNONAA", "GFRAA", "PROT", "ALBUMIN", "AST", "ALT", "ALKPHOS", "BILITOT", "BILIDIR", "IBILI" in the last 8760 hours.  RADIOGRAPHIC STUDIES: I have personally reviewed the radiological images as listed and agreed with the findings in the report. No results found.  ASSESSMENT & PLAN:   At high risk for breast cancer # Based on IBIS calculator patient's risk calculated-lifetime risk is around 30%; and risk over the next 10 years-  11%.  In general population lifetime risk is about 9%; and 10-year risk-2 to 3%.  Using endocrine therapy-the risk is almost cut down by 50%.  However, using any therapeutic intervention has not shown to have improved breast cancer specific survival or overall survival.  # Discussed the role of tamoxifen in risk reduction of development of breast cancer.  I reviewed the data of using tamoxifen 20 mg a day for 5 years.  Long discussion regarding the potential adverse events on tamoxifen including but not limited to hot flashes, mood swings, thromboembolic events strokes and also small risk of uterine cancers.  After lengthy discussion with patient feels the risk of the treatment overweigh the benefits.  And declines any endocrine therapy; which I think is very reasonable.   #Had a long discussion with the patient regarding incorporation of healthy lifestyle which includes #moderation of alcohol #abstinence of smoking #maintaining healthy BMI#exercise 150-300 minutes of moderate intensity exercise/week.  Patient does not smoke.   # I also reviewed cancer screening: a] colonoscopy at 50 years-  WNL as per patient.  b] annual PAP smear-WNL.   # GENETICS: Given patient's lack of significant family history-patient would not be a candidate for any genetic testing at this time.  Hold off any genetic referral.  Thank you, Dr.Cintron MD or allowing me to participate in the care of your pleasant patient. Please do not hesitate to contact me with questions or concerns in the interim.   # DISPOSITION: # print out # no labs-  # follow up as needed- Dr.B     Earna Coder, MD 09/09/2022 12:22 PM

## 2023-03-11 ENCOUNTER — Inpatient Hospital Stay
Admission: RE | Admit: 2023-03-11 | Discharge: 2023-03-11 | Disposition: A | Discharge status: Home / Self Care | Payer: Self-pay | Source: Ambulatory Visit | Attending: Obstetrics and Gynecology | Admitting: Obstetrics and Gynecology

## 2023-03-11 ENCOUNTER — Other Ambulatory Visit: Payer: Self-pay | Admitting: *Deleted

## 2023-03-11 DIAGNOSIS — Z1231 Encounter for screening mammogram for malignant neoplasm of breast: Secondary | ICD-10-CM

## 2023-03-12 ENCOUNTER — Other Ambulatory Visit: Payer: Self-pay | Admitting: General Surgery

## 2023-03-12 DIAGNOSIS — N6092 Unspecified benign mammary dysplasia of left breast: Secondary | ICD-10-CM

## 2023-03-13 ENCOUNTER — Encounter: Payer: Self-pay | Admitting: Internal Medicine

## 2023-03-30 ENCOUNTER — Encounter: Payer: Self-pay | Admitting: Radiology

## 2023-03-30 ENCOUNTER — Ambulatory Visit
Admission: RE | Admit: 2023-03-30 | Discharge: 2023-03-30 | Disposition: A | Discharge status: Home / Self Care | Payer: 59 | Source: Ambulatory Visit | Attending: General Surgery | Admitting: General Surgery

## 2023-03-30 DIAGNOSIS — N6092 Unspecified benign mammary dysplasia of left breast: Secondary | ICD-10-CM | POA: Diagnosis present

## 2023-09-11 ENCOUNTER — Other Ambulatory Visit: Payer: Self-pay | Admitting: General Surgery

## 2023-09-11 DIAGNOSIS — Z1231 Encounter for screening mammogram for malignant neoplasm of breast: Secondary | ICD-10-CM

## 2023-09-11 DIAGNOSIS — N63 Unspecified lump in unspecified breast: Secondary | ICD-10-CM

## 2023-09-11 DIAGNOSIS — R928 Other abnormal and inconclusive findings on diagnostic imaging of breast: Secondary | ICD-10-CM

## 2023-10-08 ENCOUNTER — Ambulatory Visit
Admission: RE | Admit: 2023-10-08 | Discharge: 2023-10-08 | Disposition: A | Discharge status: Home / Self Care | Source: Ambulatory Visit | Attending: General Surgery | Admitting: General Surgery

## 2023-10-08 ENCOUNTER — Other Ambulatory Visit: Payer: Self-pay | Admitting: General Surgery

## 2023-10-08 ENCOUNTER — Ambulatory Visit
Admission: RE | Admit: 2023-10-08 | Discharge: 2023-10-08 | Disposition: A | Discharge status: Home / Self Care | Source: Ambulatory Visit | Attending: General Surgery

## 2023-10-08 DIAGNOSIS — N63 Unspecified lump in unspecified breast: Secondary | ICD-10-CM

## 2023-10-08 DIAGNOSIS — Z1231 Encounter for screening mammogram for malignant neoplasm of breast: Secondary | ICD-10-CM | POA: Insufficient documentation

## 2023-10-08 DIAGNOSIS — R928 Other abnormal and inconclusive findings on diagnostic imaging of breast: Secondary | ICD-10-CM

## 2023-10-09 ENCOUNTER — Other Ambulatory Visit: Payer: Self-pay | Admitting: General Surgery

## 2023-10-09 DIAGNOSIS — R928 Other abnormal and inconclusive findings on diagnostic imaging of breast: Secondary | ICD-10-CM

## 2023-10-19 ENCOUNTER — Ambulatory Visit
Admission: RE | Admit: 2023-10-19 | Discharge: 2023-10-19 | Disposition: A | Discharge status: Home / Self Care | Source: Ambulatory Visit | Attending: General Surgery | Admitting: General Surgery

## 2023-10-19 ENCOUNTER — Ambulatory Visit
Admission: RE | Admit: 2023-10-19 | Discharge: 2023-10-19 | Disposition: A | Discharge status: Home / Self Care | Source: Ambulatory Visit | Attending: General Surgery

## 2023-10-19 DIAGNOSIS — R928 Other abnormal and inconclusive findings on diagnostic imaging of breast: Secondary | ICD-10-CM | POA: Insufficient documentation

## 2023-10-19 DIAGNOSIS — N6082 Other benign mammary dysplasias of left breast: Secondary | ICD-10-CM | POA: Insufficient documentation

## 2023-10-19 DIAGNOSIS — N6002 Solitary cyst of left breast: Secondary | ICD-10-CM | POA: Insufficient documentation

## 2023-10-19 HISTORY — PX: BREAST BIOPSY: SHX20

## 2023-10-19 MED ORDER — LIDOCAINE 1 % OPTIME INJ - NO CHARGE
2.0000 mL | Freq: Once | INTRAMUSCULAR | Status: AC
  Administered 2023-10-19: 2 mL
  Filled 2023-10-19: qty 2

## 2023-10-19 MED ORDER — LIDOCAINE HCL 1 % IJ SOLN
20.0000 mL | Freq: Once | INTRAMUSCULAR | Status: AC
Start: 2023-10-19 — End: 2023-10-19
  Administered 2023-10-19: 20 mL
  Filled 2023-10-19: qty 20

## 2023-10-19 MED ORDER — LIDOCAINE-EPINEPHRINE 1 %-1:100000 IJ SOLN
8.0000 mL | Freq: Once | INTRAMUSCULAR | Status: AC
  Administered 2023-10-19: 8 mL
  Filled 2023-10-19: qty 8

## 2023-10-19 MED ORDER — LIDOCAINE 1 % OPTIME INJ - NO CHARGE
5.0000 mL | Freq: Once | INTRAMUSCULAR | Status: AC
  Filled 2023-10-19: qty 6

## 2023-10-19 MED ORDER — LIDOCAINE-EPINEPHRINE 1 %-1:100000 IJ SOLN
8.0000 mL | Freq: Once | INTRAMUSCULAR | Status: AC
Start: 2023-10-19 — End: 2023-10-19
  Administered 2023-10-19: 8 mL
  Filled 2023-10-19: qty 8

## 2023-10-20 ENCOUNTER — Encounter: Payer: Self-pay | Admitting: Internal Medicine

## 2023-10-20 LAB — SURGICAL PATHOLOGY

## 2023-10-21 ENCOUNTER — Other Ambulatory Visit: Payer: Self-pay | Admitting: General Surgery

## 2023-10-21 ENCOUNTER — Encounter: Payer: Self-pay | Admitting: *Deleted

## 2023-10-21 DIAGNOSIS — N6092 Unspecified benign mammary dysplasia of left breast: Secondary | ICD-10-CM

## 2023-10-21 NOTE — Progress Notes (Unsigned)
 Received referral for newly diagnosed breast cancer from [facility].  Navigation initiated.  She is an established patient with Dr. Valentine Gasmen and will see him on 6/24 at 2:00.  She has seen Dr. Charmel Cooter previously but is considering seeing another surgeon.  She will contact her PCP to send referral to different surgeon.

## 2023-10-27 ENCOUNTER — Encounter: Payer: Self-pay | Admitting: *Deleted

## 2023-10-27 ENCOUNTER — Inpatient Hospital Stay: Attending: Internal Medicine | Admitting: Internal Medicine

## 2023-10-27 ENCOUNTER — Encounter: Payer: Self-pay | Admitting: Internal Medicine

## 2023-10-27 VITALS — BP 129/90 | HR 68 | Temp 97.6°F | Resp 12 | Ht 68.0 in | Wt 188.0 lb

## 2023-10-27 DIAGNOSIS — C50311 Malignant neoplasm of lower-inner quadrant of right female breast: Secondary | ICD-10-CM | POA: Diagnosis present

## 2023-10-27 DIAGNOSIS — C50211 Malignant neoplasm of upper-inner quadrant of right female breast: Secondary | ICD-10-CM | POA: Diagnosis not present

## 2023-10-27 DIAGNOSIS — Z170 Estrogen receptor positive status [ER+]: Secondary | ICD-10-CM | POA: Insufficient documentation

## 2023-10-27 DIAGNOSIS — Z1721 Progesterone receptor positive status: Secondary | ICD-10-CM | POA: Insufficient documentation

## 2023-10-27 DIAGNOSIS — Z9189 Other specified personal risk factors, not elsewhere classified: Secondary | ICD-10-CM

## 2023-10-27 DIAGNOSIS — Z1732 Human epidermal growth factor receptor 2 negative status: Secondary | ICD-10-CM | POA: Insufficient documentation

## 2023-10-27 NOTE — Progress Notes (Signed)
 Accompanied patient and family to initial medical oncology appointment.   Reviewed Breast Cancer treatment handbook.   Care plan summary given to patient.   Reviewed outreach programs and cancer center services.

## 2023-10-27 NOTE — Assessment & Plan Note (Addendum)
 # June 2025- RIGHT breast, there is a subtle area of architectural distortion with a favored 7 mm sonographic correlate at 1 o'clock. Recommend ultrasound-guided biopsy for definitive characterization. Recommend attention on post marker placement mammogram to assess for mammographic/sonographic correlation. 2. There is a 5 mm group of indeterminate calcifications in the LEFT breast. Recommend stereotactic guided biopsy for definitive characterization. 3. Unchanged probably benign LEFT 4 mm breast mass at 3 o'clock 7 cm from the nipple. Option for continued follow-up versus definitive characterization with biopsy was discussed with patient. Patient would prefer to proceed with definitive characterization at this point in time. As such, recommend ultrasound-guided biopsy for definitive characterization. 4. 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 Interval resolution of the second adjacent mass, consistent with a benign etiology. 5. Additional benign LEFT breast cyst is noted at 3 o'clock 7 cm from the nipple. 6. No suspicious RIGHT axillary adenopathy. 7. Given history of atypical lobular hyperplasia, recommend consideration of supplemental screening with breast MRI with and without contrast.   RECOMMENDATION: 1. RIGHT breast ultrasound-guided biopsy x1 2. LEFT breast ultrasound-guided biopsy x1 3. LEFT breast stereotactic guided biopsy x1   I have discussed the findings and recommendations with the patient. The biopsy procedure was discussed with the patient and questions were answered. Patient expressed their understanding of the biopsy recommendation. Patient will be scheduled for biopsy at her earliest convenience by the schedulers. Ordering provider will be notified. If applicable, a reminder letter will be sent to the patient regarding the next appointment.   BI-RADS CATEGORY  4: Suspicious.   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     # LEFT BREAST    # 2025- JUNE RIGHT  BREAST clinical stage I [T2] cN0; M0-ER-100; PR-95%; her 2 NEG Her 2 NEG. Surgeon: Dr.Rodenberg  # I had a long discussion with the patient in general regarding the treatment options of breast cancer including-surgery; adjuvant radiation; role of adjuvant systemic therapy including-chemotherapy antihormone therapy.    # Discussed surgical options-sentinel lymph node biopsy with lumpectomy versus mastectomy.  Discussed that lumpectomy is usually followed by adjuvant radiation.  Whereas mastectomy typically does not indicate radiation. Patient is currently ** evaluation with surgery-**.   # **Patient concerned about radiation.  Discussed with clinical trial [usually patients age> 70] the risk of recurrence from post radiation for low risk ER/PR positive breast cancer-1% versus around 10% with radiation.  However-recommend further evaluation with Dr. Camelia.  # Discussed that given patient's presurgical clinical characteristics LESS likely that she will need chemotherapy.  However final decision regarding chemotherapy based on final surgical pathology/gene assay.  Will order Oncotype after surgery.  #I discussed the role of endocrine therapy-given ER/PR positive disease postsurgery.  Patient will be offered antihormone pill 1 a day for 5 years.  Discussed potential downsides including but not limited to arthralgias hot flashes and osteoporosis.   # Bone health: ** T score= normal [PCP]; will repeat BMD. Will order at next visit.   # clinical trial: will discuss at next visit.   Thank you for allowing me to participate in the care of your pleasant patient. Please do not hesitate to contact me with questions or concerns in the interim. Follow up in 2-3 weeks after surgery. Discussed with Therisa; and also discussed with  surgery,  Dr.Rodenberg.   # DISPOSITION: # refer to Genetics re: breast cancer/ mat aunt- breast cancer # no labs today # Follow up TBD- Dr.B

## 2023-10-27 NOTE — Progress Notes (Unsigned)
 [facility] CONSULT NOTE  Patient Care Team: Lenon Layman ORN, MD as PCP - General (Internal Medicine) Schermerhorn, Debby PARAS, MD as Referring Physician (Obstetrics and Gynecology) Dessa, Reyes ORN, MD (General Surgery) Georgina Shasta POUR, RN as Oncology Nurse Navigator Rennie Cindy SAUNDERS, MD as Consulting Physician (Oncology)  CHIEF COMPLAINTS/PURPOSE OF CONSULTATION: Breast cancer  #  Oncology History Overview Note  IMPRESSION: 1. In the RIGHT breast, there is a subtle area of architectural distortion with a favored 7 mm sonographic correlate at 1 o'clock. Recommend ultrasound-guided biopsy for definitive characterization. Recommend attention on post marker placement mammogram to assess for mammographic/sonographic correlation. 2. There is a 5 mm group of indeterminate calcifications in the LEFT breast. Recommend stereotactic guided biopsy for definitive characterization. 3. Unchanged probably benign LEFT 4 mm breast mass at 3 o'clock 7 cm from the nipple. Option for continued follow-up versus definitive characterization with biopsy was discussed with patient. Patient would prefer to proceed with definitive characterization at this point in time. As such, recommend ultrasound-guided biopsy for definitive characterization. 4. 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 Interval resolution of the second adjacent mass, consistent with a benign etiology. 5. Additional benign LEFT breast cyst is noted at 3 o'clock 7 cm from the nipple. 6. No suspicious RIGHT axillary adenopathy. 7. Given history of atypical lobular hyperplasia, recommend consideration of supplemental screening with breast MRI with and without contrast.   RECOMMENDATION: 1. RIGHT breast ultrasound-guided biopsy x1 2. LEFT breast ultrasound-guided biopsy x1 3. LEFT breast stereotactic guided biopsy x1   I have discussed the findings and recommendations with the patient. The  biopsy procedure was discussed with the patient and questions were answered. Patient expressed their understanding of the biopsy recommendation. Patient will be scheduled for biopsy at her earliest convenience by the schedulers. Ordering provider will be notified. If applicable, a reminder letter will be sent to the patient regarding the next appointment.   BI-RADS CATEGORY  4: Suspicious.   Electronically Signed: By: Corean Salter M.D. On: 10/08/2023 16:27  1. 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       - TUBULE FORMATION: SCORE 1       - NUCLEAR PLEOMORPHISM: SCORE 2       - MITOTIC COUNT: SCORE 1       - TOTAL SCORE: 4       - OVERALL GRADE: 1       - LYMPHOVASCULAR INVASION: NOT IDENTIFIED       - CANCER LENGTH: 7 MM       - CALCIFICATIONS: PRESENT       - DUCTAL CARCINOMA IN SITU: PRESENT, INTERMEDIATE GRADE       - SEE NOTE.   ER-100; PR-95%; her 2 NEG   Carcinoma of upper-inner quadrant of right breast in female, estrogen receptor positive (HCC)  10/27/2023 Initial Diagnosis   Carcinoma of upper-inner quadrant of right breast in female, estrogen receptor positive (HCC)   10/27/2023 Cancer Staging   Staging form: Breast, AJCC 8th Edition - Clinical: Stage IA (cT1b, cN0, cM0, G1, ER+, PR+, HER2-) - Signed by Rennie Cindy SAUNDERS, MD on 10/27/2023 Stage prefix: Initial diagnosis Histologic grading system: 3 grade system     HISTORY OF PRESENTING ILLNESS: Patient ambulating-independently. Accompanied by husband.   Yolanda Gill 53 y.o.  female perimenopausal with no prior history of breast cancer/or malignancies has  been referred to us  for further evaluation recommendations for new diagnosis of breast cancer.   Patient states she was found to have an abnormal screening mammogram which led to diagnostic mammogram/ultrasound/followed by biopsy-as summarized above.  Family  history of breast cancer/Family history of other cancers: No family history of cancers.  Review of Systems  Constitutional:  Negative for chills, diaphoresis, fever, malaise/fatigue and weight loss.  HENT:  Negative for nosebleeds and sore throat.   Eyes:  Negative for double vision.  Respiratory:  Negative for cough, hemoptysis, sputum production, shortness of breath and wheezing.   Cardiovascular:  Negative for chest pain, palpitations, orthopnea and leg swelling.  Gastrointestinal:  Negative for abdominal pain, blood in stool, constipation, diarrhea, heartburn, melena, nausea and vomiting.  Genitourinary:  Negative for dysuria, frequency and urgency.  Musculoskeletal:  Negative for back pain and joint pain.  Skin: Negative.  Negative for itching and rash.  Neurological:  Negative for dizziness, tingling, focal weakness, weakness and headaches.  Endo/Heme/Allergies:  Does not bruise/bleed easily.  Psychiatric/Behavioral:  Negative for depression. The patient is not nervous/anxious and does not have insomnia.      MEDICAL HISTORY:  Past Medical History:  Diagnosis Date   Hypertension    Hypothyroidism    Thyroid disease     SURGICAL HISTORY: Past Surgical History:  Procedure Laterality Date   BREAST BIOPSY Left 08/2022   Atypical lobular hyperplasia   BREAST BIOPSY Bilateral 10/19/2023   2 u/s bx lt (ribbon) rt ( ) one stereo (x) clip   BREAST BIOPSY Right 10/19/2023   US  RT BREAST BX W LOC DEV 1ST LESION IMG BX SPEC US  GUIDE 10/19/2023 [facility]   BREAST BIOPSY Left 10/19/2023   MM LT BREAST BX W LOC DEV 1ST LESION IMAGE BX SPEC STEREO GUIDE 10/19/2023 [facility]   BREAST BIOPSY Left 10/19/2023   US  LT BREAST BX W LOC DEV EA ADD LESION IMG BX SPEC US  GUIDE 10/19/2023 [facility]   BREAST SURGERY Left 10/31/2013   Radial scar without atypia   FINE NEEDLE ASPIRATION Left    when she was in college - left breast   WISDOM TOOTH EXTRACTION  05/05/1988    SOCIAL  HISTORY: Social History   Socioeconomic History   Marital status: Married    Spouse name: Not on file   Number of children: Not on file   Years of education: Not on file   Highest education level: Not on file  Occupational History   Not on file  Tobacco Use   Smoking status: Never   Smokeless tobacco: Never  Substance and Sexual Activity   Alcohol use: No   Drug use: No   Sexual activity: Not on file  Other Topics Concern   Not on file  Social History Narrative   Not on file   Social Drivers of Health   Financial Resource Strain: Low Risk  (09/25/2022)   Received from [facility]   Overall Financial Resource Strain (CARDIA)    Difficulty of Paying Living Expenses: Not hard at all  Food Insecurity: No Food Insecurity (10/27/2023)   Hunger Vital Sign    Worried About Running Out of Food in the Last Year: Never true    Ran Out of Food in the Last Year: Never true  Transportation Needs: No Transportation Needs (10/27/2023)   PRAPARE - Administrator, Civil Service (Medical): No    Lack of Transportation (Non-Medical): No  Physical Activity: Not on file  Stress: Not  on file  Social Connections: Not on file  Intimate Partner Violence: Not At Risk (10/27/2023)   Humiliation, Afraid, Rape, and Kick questionnaire    Fear of Current or Ex-Partner: No    Emotionally Abused: No    Physically Abused: No    Sexually Abused: No    FAMILY HISTORY: Family History  Problem Relation Age of Onset   Hypertension Mother    Thyroid disease Mother    Hypertension Father    Breast cancer Maternal Aunt    Cancer Maternal Aunt        Breast Cancer     ALLERGIES:  has no known allergies.  MEDICATIONS:  Current Outpatient Medications  Medication Sig Dispense Refill   cetirizine (ZYRTEC) 10 MG tablet Take by mouth.     levothyroxine (SYNTHROID, LEVOTHROID) 75 MCG tablet Take 1 tablet by mouth daily.     losartan-hydrochlorothiazide (HYZAAR) 100-12.5 MG  tablet Take 1 tablet by mouth daily.     No current facility-administered medications for this visit.   PHYSICAL EXAMINATION:   Vitals:   10/27/23 1355  BP: (!) 129/90  Pulse: 68  Resp: 12  Temp: 97.6 F (36.4 C)  SpO2: 100%   Filed Weights   10/27/23 1355  Weight: 188 lb (85.3 kg)    Physical Exam Vitals and nursing note reviewed.  HENT:     Head: Normocephalic and atraumatic.     Mouth/Throat:     Pharynx: Oropharynx is clear.   Eyes:     Extraocular Movements: Extraocular movements intact.     Pupils: Pupils are equal, round, and reactive to light.    Cardiovascular:     Rate and Rhythm: Normal rate and regular rhythm.  Pulmonary:     Comments: Decreased breath sounds bilaterally.  Abdominal:     Palpations: Abdomen is soft.   Musculoskeletal:        General: Normal range of motion.     Cervical back: Normal range of motion.   Skin:    General: Skin is warm.   Neurological:     General: No focal deficit present.     Mental Status: She is alert and oriented to person, place, and time.   Psychiatric:        Behavior: Behavior normal.        Judgment: Judgment normal.      LABORATORY DATA:  I have reviewed the data as listed No results found for: WBC, HGB, HCT, MCV, PLT No results for input(s): NA, K, CL, CO2, GLUCOSE, BUN, CREATININE, CALCIUM, GFRNONAA, GFRAA, PROT, ALBUMIN, AST, ALT, ALKPHOS, BILITOT, BILIDIR, IBILI in the last 8760 hours.  RADIOGRAPHIC STUDIES: I have personally reviewed the radiological images as listed and agreed with the findings in the report. US  RT BREAST BX W LOC DEV 1ST LESION IMG BX SPEC US  GUIDE Addendum Date: 10/20/2023 ADDENDUM REPORT: 10/20/2023 15:43 ADDENDUM: PATHOLOGY revealed: Site 1. 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 - OVERALL GRADE: - LYMPHOVASCULAR INVASION: NOT IDENTIFIED -  CANCER LENGTH: 7 MM - CALCIFICATIONS: PRESENT - DUCTAL CARCINOMA IN SITU: PRESENT, INTERMEDIATE GRADE. Pathology results are CONCORDANT with imaging findings, per Dr. Alm Parkins. PATHOLOGY revealed: Site 2. Breast, left, needle core biopsy, 3 o'clock, 7 cmfn, heart clip : - PARTIAL SAMPLING OF SIMPLE CYST WALL - ADJACENT BREAST TISSUE WITH ATYPICAL LOBULAR HYPERPLASIA, APOCRINE METAPLASIA, COLUMNAR CELL CHANGE, AND PSEUDOANGIOMATOUS STROMAL HYPERPLASIA. Pathology results are CONCORDANT with imaging findings, per  Dr. Alm Parkins with short term follow-up recommended. PATHOLOGY revealed: Site 3. Breast, left, needle core biopsy, upper, outer quadrant, posterior, x clip : - COLUMNAR CELL CHANGE WITH CALCIFICATION - SMALL CYST FORMATION - NEGATIVE FOR ATYPIA AND MALIGNANCY. Pathology results are CONCORDANT with imaging findings, per Dr. Alm Parkins. Pathology results and recommendations below were discussed with patient by telephone on 10/20/2023 by Rock Hover RN. Patient reported biopsy site within normal limits with slight tenderness at the site. Post biopsy care instructions were reviewed, questions were answered and my direct phone number was provided to patient. Patient was instructed to call [facility] if any concerns or questions arise related to the biopsy. RECOMMENDATIONS: 1. Surgical and oncological consultation for Site 1 only. Request for surgical and oncological consultation relayed to Shasta Ada RN at [facility] by Rock Hover RN on 10/20/2023. 2. Patient to return in six months for unilateral LEFT breast diagnostic mammogram (and ultrasound if deemed necessary) to ensure stability of Site 2 - Atypical Lobular Hyperplasia. Rock Hover RN notified [facility] schedulers on 10/20/2023 with request to schedule this appointment and contact patient with appointment information. 3. Consider pretreatment bilateral breast MRI with and without contrast to assess extent of breast disease,  given the patient's breast density. Pathology results reported by Rock Hover RN on 10/20/2023. Electronically Signed   By: Alm Parkins M.D.   On: 10/20/2023 15:43   Result Date: 10/20/2023 CLINICAL DATA:  Patent presents for ultrasound-guided core needle biopsy a right breast mass, ultrasound-guided core needle biopsy of a left breast mass and stereotactic core needle biopsy of left breast calcifications. EXAM: ULTRASOUND GUIDED RIGHT BREAST CORE NEEDLE BIOPSY ULTRASOUND GUIDED LEFT BREAST CORE NEEDLE BIOPSY LEFT BREAST STEREOTACTIC CORE NEEDLE BIOPSY COMPARISON:  Previous exam(s). FINDINGS: Ultrasound Biopsies I met with the patient and we discussed the procedure of ultrasound-guided biopsy, including benefits and alternatives. We discussed the high likelihood of a successful procedure. We discussed the risks of the procedure, including infection, bleeding, tissue injury, clip migration, and inadequate sampling. Informed written consent was given. The usual time-out protocol was performed immediately prior to the procedure. Biopsy #1: 7 mm mass distortion, right breast, 1 o'clock, 6 cm the nipple. Lesion quadrant: Lower inner quadrant Using sterile technique and 1% Lidocaine  as local anesthetic, under direct ultrasound visualization, a 12 gauge spring-loaded device was used to perform biopsy of the irregular hypoechoic mass at 1 o'clock using an inferior approach. At the conclusion of the procedure a ribbon shaped tissue marker clip was deployed into the biopsy cavity. Follow up 2 view mammogram was performed and dictated separately. Biopsy #2: 4 mm mass, 3 o'clock, 7 cm the nipple. Lesion quadrant: Upper outer quadrant Using sterile technique and 1% Lidocaine  as local anesthetic, under direct ultrasound visualization, a 14 gauge spring-loaded device was used to perform biopsy of small mass at 3 o'clock using a lateral approach. At the conclusion of the procedure a heart shaped tissue marker clip was deployed  into the biopsy cavity. Follow up 2 view mammogram was performed and dictated separately. Stereotactic Biopsy The patient and I discussed the procedure of stereotactic-guided biopsy including benefits and alternatives. We discussed the high likelihood of a successful procedure. We discussed the risks of the procedure including infection, bleeding, tissue injury, clip migration, and inadequate sampling. Informed written consent was given. The usual time out protocol was performed immediately prior to the procedure. Biopsy #3: 5 mm group indeterminate calcifications posterolateral breast. Using sterile technique and 1% Lidocaine   as local anesthetic, under stereotactic guidance, a 9 gauge vacuum assisted device was used to perform core needle biopsy of calcifications in left breast posterior depth, using a superior approach. Specimen radiograph was performed showing calcifications for which biopsy was performed. Specimens with calcifications are identified for pathology. Lesion quadrant: Upper outer quadrant At the conclusion of the procedure, an X shaped tissue marker clip was deployed into the biopsy cavity. Follow-up 2-view mammogram was performed and dictated separately. IMPRESSION: Ultrasound guided biopsy of the right breast. No apparent complications. Ultrasound guided biopsy of the left breast. No apparent complications. Stereotactic-guided biopsy of the left breast. No apparent complications. Electronically Signed: By: Alm Parkins M.D. On: 10/19/2023 09:43   US  LT BREAST BX W LOC DEV EA ADD LESION IMG BX SPEC US  GUIDE Addendum Date: 10/20/2023 ADDENDUM REPORT: 10/20/2023 15:43 ADDENDUM: PATHOLOGY revealed: Site 1. 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 - OVERALL GRADE: - LYMPHOVASCULAR INVASION: NOT IDENTIFIED - CANCER LENGTH: 7 MM - CALCIFICATIONS: PRESENT - DUCTAL CARCINOMA IN SITU: PRESENT, INTERMEDIATE GRADE. Pathology  results are CONCORDANT with imaging findings, per Dr. Alm Parkins. PATHOLOGY revealed: Site 2. Breast, left, needle core biopsy, 3 o'clock, 7 cmfn, heart clip : - PARTIAL SAMPLING OF SIMPLE CYST WALL - ADJACENT BREAST TISSUE WITH ATYPICAL LOBULAR HYPERPLASIA, APOCRINE METAPLASIA, COLUMNAR CELL CHANGE, AND PSEUDOANGIOMATOUS STROMAL HYPERPLASIA. Pathology results are CONCORDANT with imaging findings, per Dr. Alm Parkins with short term follow-up recommended. PATHOLOGY revealed: Site 3. Breast, left, needle core biopsy, upper, outer quadrant, posterior, x clip : - COLUMNAR CELL CHANGE WITH CALCIFICATION - SMALL CYST FORMATION - NEGATIVE FOR ATYPIA AND MALIGNANCY. Pathology results are CONCORDANT with imaging findings, per Dr. Alm Parkins. Pathology results and recommendations below were discussed with patient by telephone on 10/20/2023 by Rock Hover RN. Patient reported biopsy site within normal limits with slight tenderness at the site. Post biopsy care instructions were reviewed, questions were answered and my direct phone number was provided to patient. Patient was instructed to call [facility] if any concerns or questions arise related to the biopsy. RECOMMENDATIONS: 1. Surgical and oncological consultation for Site 1 only. Request for surgical and oncological consultation relayed to Shasta Ada RN at [facility] by Rock Hover RN on 10/20/2023. 2. Patient to return in six months for unilateral LEFT breast diagnostic mammogram (and ultrasound if deemed necessary) to ensure stability of Site 2 - Atypical Lobular Hyperplasia. Rock Hover RN notified [facility] schedulers on 10/20/2023 with request to schedule this appointment and contact patient with appointment information. 3. Consider pretreatment bilateral breast MRI with and without contrast to assess extent of breast disease, given the patient's breast density. Pathology results reported by Rock Hover RN on 10/20/2023. Electronically Signed    By: Alm Parkins M.D.   On: 10/20/2023 15:43   Result Date: 10/20/2023 CLINICAL DATA:  Patent presents for ultrasound-guided core needle biopsy a right breast mass, ultrasound-guided core needle biopsy of a left breast mass and stereotactic core needle biopsy of left breast calcifications. EXAM: ULTRASOUND GUIDED RIGHT BREAST CORE NEEDLE BIOPSY ULTRASOUND GUIDED LEFT BREAST CORE NEEDLE BIOPSY LEFT BREAST STEREOTACTIC CORE NEEDLE BIOPSY COMPARISON:  Previous exam(s). FINDINGS: Ultrasound Biopsies I met with the patient and we discussed the procedure of ultrasound-guided biopsy, including benefits and alternatives. We discussed the high likelihood of a successful procedure. We discussed the risks of the procedure, including infection, bleeding, tissue injury, clip migration, and inadequate  sampling. Informed written consent was given. The usual time-out protocol was performed immediately prior to the procedure. Biopsy #1: 7 mm mass distortion, right breast, 1 o'clock, 6 cm the nipple. Lesion quadrant: Lower inner quadrant Using sterile technique and 1% Lidocaine  as local anesthetic, under direct ultrasound visualization, a 12 gauge spring-loaded device was used to perform biopsy of the irregular hypoechoic mass at 1 o'clock using an inferior approach. At the conclusion of the procedure a ribbon shaped tissue marker clip was deployed into the biopsy cavity. Follow up 2 view mammogram was performed and dictated separately. Biopsy #2: 4 mm mass, 3 o'clock, 7 cm the nipple. Lesion quadrant: Upper outer quadrant Using sterile technique and 1% Lidocaine  as local anesthetic, under direct ultrasound visualization, a 14 gauge spring-loaded device was used to perform biopsy of small mass at 3 o'clock using a lateral approach. At the conclusion of the procedure a heart shaped tissue marker clip was deployed into the biopsy cavity. Follow up 2 view mammogram was performed and dictated separately. Stereotactic Biopsy The  patient and I discussed the procedure of stereotactic-guided biopsy including benefits and alternatives. We discussed the high likelihood of a successful procedure. We discussed the risks of the procedure including infection, bleeding, tissue injury, clip migration, and inadequate sampling. Informed written consent was given. The usual time out protocol was performed immediately prior to the procedure. Biopsy #3: 5 mm group indeterminate calcifications posterolateral breast. Using sterile technique and 1% Lidocaine  as local anesthetic, under stereotactic guidance, a 9 gauge vacuum assisted device was used to perform core needle biopsy of calcifications in left breast posterior depth, using a superior approach. Specimen radiograph was performed showing calcifications for which biopsy was performed. Specimens with calcifications are identified for pathology. Lesion quadrant: Upper outer quadrant At the conclusion of the procedure, an X shaped tissue marker clip was deployed into the biopsy cavity. Follow-up 2-view mammogram was performed and dictated separately. IMPRESSION: Ultrasound guided biopsy of the right breast. No apparent complications. Ultrasound guided biopsy of the left breast. No apparent complications. Stereotactic-guided biopsy of the left breast. No apparent complications. Electronically Signed: By: Alm Parkins M.D. On: 10/19/2023 09:43   MM LT BREAST BX W LOC DEV 1ST LESION IMAGE BX SPEC STEREO GUIDE Addendum Date: 10/20/2023 ADDENDUM REPORT: 10/20/2023 15:43 ADDENDUM: PATHOLOGY revealed: Site 1. 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 - OVERALL GRADE: - LYMPHOVASCULAR INVASION: NOT IDENTIFIED - CANCER LENGTH: 7 MM - CALCIFICATIONS: PRESENT - DUCTAL CARCINOMA IN SITU: PRESENT, INTERMEDIATE GRADE. Pathology results are CONCORDANT with imaging findings, per Dr. Alm Parkins. PATHOLOGY revealed: Site 2. Breast, left,  needle core biopsy, 3 o'clock, 7 cmfn, heart clip : - PARTIAL SAMPLING OF SIMPLE CYST WALL - ADJACENT BREAST TISSUE WITH ATYPICAL LOBULAR HYPERPLASIA, APOCRINE METAPLASIA, COLUMNAR CELL CHANGE, AND PSEUDOANGIOMATOUS STROMAL HYPERPLASIA. Pathology results are CONCORDANT with imaging findings, per Dr. Alm Parkins with short term follow-up recommended. PATHOLOGY revealed: Site 3. Breast, left, needle core biopsy, upper, outer quadrant, posterior, x clip : - COLUMNAR CELL CHANGE WITH CALCIFICATION - SMALL CYST FORMATION - NEGATIVE FOR ATYPIA AND MALIGNANCY. Pathology results are CONCORDANT with imaging findings, per Dr. Alm Parkins. Pathology results and recommendations below were discussed with patient by telephone on 10/20/2023 by Rock Hover RN. Patient reported biopsy site within normal limits with slight tenderness at the site. Post biopsy care instructions were reviewed, questions were answered and my direct phone number was provided to patient.  Patient was instructed to call [facility] if any concerns or questions arise related to the biopsy. RECOMMENDATIONS: 1. Surgical and oncological consultation for Site 1 only. Request for surgical and oncological consultation relayed to Shasta Ada RN at [facility] by Rock Hover RN on 10/20/2023. 2. Patient to return in six months for unilateral LEFT breast diagnostic mammogram (and ultrasound if deemed necessary) to ensure stability of Site 2 - Atypical Lobular Hyperplasia. Rock Hover RN notified [facility] schedulers on 10/20/2023 with request to schedule this appointment and contact patient with appointment information. 3. Consider pretreatment bilateral breast MRI with and without contrast to assess extent of breast disease, given the patient's breast density. Pathology results reported by Rock Hover RN on 10/20/2023. Electronically Signed   By: Alm Parkins M.D.   On: 10/20/2023 15:43   Result Date: 10/20/2023 CLINICAL DATA:  Patent presents for  ultrasound-guided core needle biopsy a right breast mass, ultrasound-guided core needle biopsy of a left breast mass and stereotactic core needle biopsy of left breast calcifications. EXAM: ULTRASOUND GUIDED RIGHT BREAST CORE NEEDLE BIOPSY ULTRASOUND GUIDED LEFT BREAST CORE NEEDLE BIOPSY LEFT BREAST STEREOTACTIC CORE NEEDLE BIOPSY COMPARISON:  Previous exam(s). FINDINGS: Ultrasound Biopsies I met with the patient and we discussed the procedure of ultrasound-guided biopsy, including benefits and alternatives. We discussed the high likelihood of a successful procedure. We discussed the risks of the procedure, including infection, bleeding, tissue injury, clip migration, and inadequate sampling. Informed written consent was given. The usual time-out protocol was performed immediately prior to the procedure. Biopsy #1: 7 mm mass distortion, right breast, 1 o'clock, 6 cm the nipple. Lesion quadrant: Lower inner quadrant Using sterile technique and 1% Lidocaine  as local anesthetic, under direct ultrasound visualization, a 12 gauge spring-loaded device was used to perform biopsy of the irregular hypoechoic mass at 1 o'clock using an inferior approach. At the conclusion of the procedure a ribbon shaped tissue marker clip was deployed into the biopsy cavity. Follow up 2 view mammogram was performed and dictated separately. Biopsy #2: 4 mm mass, 3 o'clock, 7 cm the nipple. Lesion quadrant: Upper outer quadrant Using sterile technique and 1% Lidocaine  as local anesthetic, under direct ultrasound visualization, a 14 gauge spring-loaded device was used to perform biopsy of small mass at 3 o'clock using a lateral approach. At the conclusion of the procedure a heart shaped tissue marker clip was deployed into the biopsy cavity. Follow up 2 view mammogram was performed and dictated separately. Stereotactic Biopsy The patient and I discussed the procedure of stereotactic-guided biopsy including benefits and alternatives. We  discussed the high likelihood of a successful procedure. We discussed the risks of the procedure including infection, bleeding, tissue injury, clip migration, and inadequate sampling. Informed written consent was given. The usual time out protocol was performed immediately prior to the procedure. Biopsy #3: 5 mm group indeterminate calcifications posterolateral breast. Using sterile technique and 1% Lidocaine  as local anesthetic, under stereotactic guidance, a 9 gauge vacuum assisted device was used to perform core needle biopsy of calcifications in left breast posterior depth, using a superior approach. Specimen radiograph was performed showing calcifications for which biopsy was performed. Specimens with calcifications are identified for pathology. Lesion quadrant: Upper outer quadrant At the conclusion of the procedure, an X shaped tissue marker clip was deployed into the biopsy cavity. Follow-up 2-view mammogram was performed and dictated separately. IMPRESSION: Ultrasound guided biopsy of the right breast. No apparent complications. Ultrasound guided biopsy of the left breast. No apparent  complications. Stereotactic-guided biopsy of the left breast. No apparent complications. Electronically Signed: By: Alm Parkins M.D. On: 10/19/2023 09:43   MM CLIP PLACEMENT RIGHT Result Date: 10/19/2023 CLINICAL DATA:  Assess post biopsy marker clip placement following ultrasound-guided core needle biopsy of the right breast, ultrasound-guided core needle biopsy of left breast and stereotactic core needle biopsy of the left breast. EXAM: 3D DIAGNOSTIC RIGHT MAMMOGRAM POST ULTRASOUND BIOPSY 3D DIAGNOSTIC LEFT MAMMOGRAM POST ULTRASOUND BIOPSY 3D DIAGNOSTIC LEFT MAMMOGRAM POST STEREOTACTIC BIOPSY COMPARISON:  Previous exam(s). ACR Breast Density Category c: The breasts are heterogeneously dense, which may obscure small masses. FINDINGS: 3D Mammographic images were obtained following ultrasound guided biopsy of a mass with  distortion the upper inner right breast. The biopsy marking clip is in expected position at the site of biopsy. 3D Mammographic images were obtained following ultrasound guided biopsy of a small mass the lateral left breast. The biopsy marking clip is in expected position at the site of biopsy. 3D Mammographic images were obtained following stereotactic guided biopsy of left breast calcifications. The biopsy marking clip is in expected position at the site of biopsy. IMPRESSION: Appropriate positioning of the ribbon shaped biopsy marking clip at the site of biopsy in the upper inner right breast along the anterior margin the distortion. Appropriate positioning of the heart shaped biopsy marking clip at the site of biopsy in the lateral left breast the expected location the small mass 3 o'clock. Appropriate positioning of the X shaped biopsy marking clip at the site of biopsy in the posterior upper outer left breast adjacent to several residual calcifications. Final Assessment: Post Procedure Mammograms for Marker Placement Electronically Signed   By: Alm Parkins M.D.   On: 10/19/2023 09:56   MM CLIP PLACEMENT LEFT Result Date: 10/19/2023 CLINICAL DATA:  Assess post biopsy marker clip placement following ultrasound-guided core needle biopsy of the right breast, ultrasound-guided core needle biopsy of left breast and stereotactic core needle biopsy of the left breast. EXAM: 3D DIAGNOSTIC RIGHT MAMMOGRAM POST ULTRASOUND BIOPSY 3D DIAGNOSTIC LEFT MAMMOGRAM POST ULTRASOUND BIOPSY 3D DIAGNOSTIC LEFT MAMMOGRAM POST STEREOTACTIC BIOPSY COMPARISON:  Previous exam(s). ACR Breast Density Category c: The breasts are heterogeneously dense, which may obscure small masses. FINDINGS: 3D Mammographic images were obtained following ultrasound guided biopsy of a mass with distortion the upper inner right breast. The biopsy marking clip is in expected position at the site of biopsy. 3D Mammographic images were obtained following  ultrasound guided biopsy of a small mass the lateral left breast. The biopsy marking clip is in expected position at the site of biopsy. 3D Mammographic images were obtained following stereotactic guided biopsy of left breast calcifications. The biopsy marking clip is in expected position at the site of biopsy. IMPRESSION: Appropriate positioning of the ribbon shaped biopsy marking clip at the site of biopsy in the upper inner right breast along the anterior margin the distortion. Appropriate positioning of the heart shaped biopsy marking clip at the site of biopsy in the lateral left breast the expected location the small mass 3 o'clock. Appropriate positioning of the X shaped biopsy marking clip at the site of biopsy in the posterior upper outer left breast adjacent to several residual calcifications. Final Assessment: Post Procedure Mammograms for Marker Placement Electronically Signed   By: Alm Parkins M.D.   On: 10/19/2023 09:56   MM 3D DIAGNOSTIC MAMMOGRAM BILATERAL BREAST Addendum Date: 10/16/2023 ADDENDUM REPORT: 10/16/2023 16:14 ADDENDUM: Transcription error: At 2 o'clock 3 cm from the nipple there is  revisualization of an oval circumscribed hypoechoic mass. It measures 12 x 6 by 12 mm, NOT significantly changed comparison to prior. It demonstrates an associated echogenic focus consistent with COIL biopsy marking clip AND is consistent with a benign biopsy proven fibroadenoma. Adjacent mass at 1 o'clock 3 cm from the nipple is no longer visualized. Electronically Signed   By: Corean Salter M.D.   On: 10/16/2023 16:14   Result Date: 10/16/2023 CLINICAL DATA:  Patient is status post ultrasound-guided biopsy on April 2024 which demonstrated atypical lobular hyperplasia and fibroadenoma (1 o'clock 3 cm from the nipple). Sampling was performed of the adjacent mass at 2 o'clock 3 cm the nipple at same time. Surgical excision was deferred. It is not documented which mass the COIL clip was placed into  but is favored to be within the mass at 2 o'clock. Patient presents for short-term follow-up. Review of prior sonographic images demonstrated an additional probably benign mass at 3 o'clock 7 cm from the nipple. The mass at 1 o'clock 3 cm from the nipple was less sonographically discrete on a recent November 2024 diagnostic workup. Patient is due for RIGHT breast screening. EXAM: DIGITAL DIAGNOSTIC BILATERAL MAMMOGRAM WITH TOMOSYNTHESIS AND CAD; ULTRASOUND RIGHT BREAST LIMITED; ULTRASOUND LEFT BREAST LIMITED TECHNIQUE: Bilateral digital diagnostic mammography and breast tomosynthesis was performed. The images were evaluated with computer-aided detection. ; Targeted ultrasound examination of the right breast was performed; Targeted ultrasound examination of the left breast was performed. COMPARISON:  Previous exam(s). ACR Breast Density Category c: The breasts are heterogeneously dense, which may obscure small masses. FINDINGS: In the RIGHT upper slightly inner breast at middle depth, there is an area of subtle architectural distortion. It is best seen on CC slice 41, ML slice 28, spot MLO slice 28, MLO slice 31. It becomes less conspicuous with spot CC images but is favored to subtly persist on spot CC slice 30. Diagnostic images of the LEFT breast demonstrate mammographic stability of an oval mass with associated COIL biopsy marking clip. This is consistent with benign biopsy proven fibroadenoma. Spot magnification views of the LEFT breast demonstrate a new group of branching and punctate calcifications in the upper outer breast at posterior depth. These span approximately 5 mm. On physical exam, no suspicious mass is appreciated. Targeted ultrasound was performed of the RIGHT breast. At 1 o'clock 6 cm from nipple, there is an irregular hypoechoic mass with indistinct margins. This measures 6 x 7 x 4 mm. This is favored to correspond to the area of architectural distortion. Targeted ultrasound was performed of the  RIGHT axilla. No suspicious axillary lymph nodes are visualized. Targeted ultrasound was performed of the LEFT breast. At 2 o'clock 3 cm from the nipple there is revisualization of an oval circumscribed hypoechoic mass. It measures 12 x 6 by 12 mm, significantly changed comparison to prior. It demonstrates an associated echogenic focus consistent with COIL biopsy marking clip is consistent with a benign biopsy proven fibroadenoma. Adjacent mass at 1 o'clock 3 cm from the nipple is no longer visualized. At 3 o'clock 7 cm from the nipple there is an oval circumscribed anechoic mass with posterior acoustic enhancement. This measures 8 by 8 x 5 mm and is consistent with a benign cyst. Today falling at 2-2:30 7 cm from the nipple but labeled as 3 o'clock 7 cm from the nipple for consistency purposes, there is revisualization of an oval circumscribed hypoechoic mass. It measures 4 x 3 x 4 mm, unchanged in comparison to prior. IMPRESSION:  1. In the RIGHT breast, there is a subtle area of architectural distortion with a favored 7 mm sonographic correlate at 1 o'clock. Recommend ultrasound-guided biopsy for definitive characterization. Recommend attention on post marker placement mammogram to assess for mammographic/sonographic correlation. 2. There is a 5 mm group of indeterminate calcifications in the LEFT breast. Recommend stereotactic guided biopsy for definitive characterization. 3. Unchanged probably benign LEFT 4 mm breast mass at 3 o'clock 7 cm from the nipple. Option for continued follow-up versus definitive characterization with biopsy was discussed with patient. Patient would prefer to proceed with definitive characterization at this point in time. As such, recommend ultrasound-guided biopsy for definitive characterization. 4. 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 Interval resolution of the second adjacent mass, consistent with a benign etiology. 5. Additional benign LEFT breast cyst is  noted at 3 o'clock 7 cm from the nipple. 6. No suspicious RIGHT axillary adenopathy. 7. Given history of atypical lobular hyperplasia, recommend consideration of supplemental screening with breast MRI with and without contrast. RECOMMENDATION: 1. RIGHT breast ultrasound-guided biopsy x1 2. LEFT breast ultrasound-guided biopsy x1 3. LEFT breast stereotactic guided biopsy x1 I have discussed the findings and recommendations with the patient. The biopsy procedure was discussed with the patient and questions were answered. Patient expressed their understanding of the biopsy recommendation. Patient will be scheduled for biopsy at her earliest convenience by the schedulers. Ordering provider will be notified. If applicable, a reminder letter will be sent to the patient regarding the next appointment. BI-RADS CATEGORY  4: Suspicious. Electronically Signed: By: Corean Salter M.D. On: 10/08/2023 16:27   US  LIMITED ULTRASOUND INCLUDING AXILLA LEFT BREAST  Addendum Date: 10/16/2023 ADDENDUM REPORT: 10/16/2023 16:14 ADDENDUM: Transcription error: At 2 o'clock 3 cm from the nipple there is revisualization of an oval circumscribed hypoechoic mass. It measures 12 x 6 by 12 mm, NOT significantly changed comparison to prior. It demonstrates an associated echogenic focus consistent with COIL biopsy marking clip AND is consistent with a benign biopsy proven fibroadenoma. Adjacent mass at 1 o'clock 3 cm from the nipple is no longer visualized. Electronically Signed   By: Corean Salter M.D.   On: 10/16/2023 16:14   Result Date: 10/16/2023 CLINICAL DATA:  Patient is status post ultrasound-guided biopsy on April 2024 which demonstrated atypical lobular hyperplasia and fibroadenoma (1 o'clock 3 cm from the nipple). Sampling was performed of the adjacent mass at 2 o'clock 3 cm the nipple at same time. Surgical excision was deferred. It is not documented which mass the COIL clip was placed into but is favored to be within the  mass at 2 o'clock. Patient presents for short-term follow-up. Review of prior sonographic images demonstrated an additional probably benign mass at 3 o'clock 7 cm from the nipple. The mass at 1 o'clock 3 cm from the nipple was less sonographically discrete on a recent November 2024 diagnostic workup. Patient is due for RIGHT breast screening. EXAM: DIGITAL DIAGNOSTIC BILATERAL MAMMOGRAM WITH TOMOSYNTHESIS AND CAD; ULTRASOUND RIGHT BREAST LIMITED; ULTRASOUND LEFT BREAST LIMITED TECHNIQUE: Bilateral digital diagnostic mammography and breast tomosynthesis was performed. The images were evaluated with computer-aided detection. ; Targeted ultrasound examination of the right breast was performed; Targeted ultrasound examination of the left breast was performed. COMPARISON:  Previous exam(s). ACR Breast Density Category c: The breasts are heterogeneously dense, which may obscure small masses. FINDINGS: In the RIGHT upper slightly inner breast at middle depth, there is an area of subtle architectural distortion. It is best seen on CC slice  41, ML slice 28, spot MLO slice 28, MLO slice 31. It becomes less conspicuous with spot CC images but is favored to subtly persist on spot CC slice 30. Diagnostic images of the LEFT breast demonstrate mammographic stability of an oval mass with associated COIL biopsy marking clip. This is consistent with benign biopsy proven fibroadenoma. Spot magnification views of the LEFT breast demonstrate a new group of branching and punctate calcifications in the upper outer breast at posterior depth. These span approximately 5 mm. On physical exam, no suspicious mass is appreciated. Targeted ultrasound was performed of the RIGHT breast. At 1 o'clock 6 cm from nipple, there is an irregular hypoechoic mass with indistinct margins. This measures 6 x 7 x 4 mm. This is favored to correspond to the area of architectural distortion. Targeted ultrasound was performed of the RIGHT axilla. No suspicious  axillary lymph nodes are visualized. Targeted ultrasound was performed of the LEFT breast. At 2 o'clock 3 cm from the nipple there is revisualization of an oval circumscribed hypoechoic mass. It measures 12 x 6 by 12 mm, significantly changed comparison to prior. It demonstrates an associated echogenic focus consistent with COIL biopsy marking clip is consistent with a benign biopsy proven fibroadenoma. Adjacent mass at 1 o'clock 3 cm from the nipple is no longer visualized. At 3 o'clock 7 cm from the nipple there is an oval circumscribed anechoic mass with posterior acoustic enhancement. This measures 8 by 8 x 5 mm and is consistent with a benign cyst. Today falling at 2-2:30 7 cm from the nipple but labeled as 3 o'clock 7 cm from the nipple for consistency purposes, there is revisualization of an oval circumscribed hypoechoic mass. It measures 4 x 3 x 4 mm, unchanged in comparison to prior. IMPRESSION: 1. In the RIGHT breast, there is a subtle area of architectural distortion with a favored 7 mm sonographic correlate at 1 o'clock. Recommend ultrasound-guided biopsy for definitive characterization. Recommend attention on post marker placement mammogram to assess for mammographic/sonographic correlation. 2. There is a 5 mm group of indeterminate calcifications in the LEFT breast. Recommend stereotactic guided biopsy for definitive characterization. 3. Unchanged probably benign LEFT 4 mm breast mass at 3 o'clock 7 cm from the nipple. Option for continued follow-up versus definitive characterization with biopsy was discussed with patient. Patient would prefer to proceed with definitive characterization at this point in time. As such, recommend ultrasound-guided biopsy for definitive characterization. 4. 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 Interval resolution of the second adjacent mass, consistent with a benign etiology. 5. Additional benign LEFT breast cyst is noted at 3 o'clock 7 cm from  the nipple. 6. No suspicious RIGHT axillary adenopathy. 7. Given history of atypical lobular hyperplasia, recommend consideration of supplemental screening with breast MRI with and without contrast. RECOMMENDATION: 1. RIGHT breast ultrasound-guided biopsy x1 2. LEFT breast ultrasound-guided biopsy x1 3. LEFT breast stereotactic guided biopsy x1 I have discussed the findings and recommendations with the patient. The biopsy procedure was discussed with the patient and questions were answered. Patient expressed their understanding of the biopsy recommendation. Patient will be scheduled for biopsy at her earliest convenience by the schedulers. Ordering provider will be notified. If applicable, a reminder letter will be sent to the patient regarding the next appointment. BI-RADS CATEGORY  4: Suspicious. Electronically Signed: By: Corean Salter M.D. On: 10/08/2023 16:27   US  LIMITED ULTRASOUND INCLUDING AXILLA RIGHT BREAST Addendum Date: 10/16/2023 ADDENDUM REPORT: 10/16/2023 16:14 ADDENDUM: Transcription error: At 2 o'clock  3 cm from the nipple there is revisualization of an oval circumscribed hypoechoic mass. It measures 12 x 6 by 12 mm, NOT significantly changed comparison to prior. It demonstrates an associated echogenic focus consistent with COIL biopsy marking clip AND is consistent with a benign biopsy proven fibroadenoma. Adjacent mass at 1 o'clock 3 cm from the nipple is no longer visualized. Electronically Signed   By: Corean Salter M.D.   On: 10/16/2023 16:14   Result Date: 10/16/2023 CLINICAL DATA:  Patient is status post ultrasound-guided biopsy on April 2024 which demonstrated atypical lobular hyperplasia and fibroadenoma (1 o'clock 3 cm from the nipple). Sampling was performed of the adjacent mass at 2 o'clock 3 cm the nipple at same time. Surgical excision was deferred. It is not documented which mass the COIL clip was placed into but is favored to be within the mass at 2 o'clock. Patient  presents for short-term follow-up. Review of prior sonographic images demonstrated an additional probably benign mass at 3 o'clock 7 cm from the nipple. The mass at 1 o'clock 3 cm from the nipple was less sonographically discrete on a recent November 2024 diagnostic workup. Patient is due for RIGHT breast screening. EXAM: DIGITAL DIAGNOSTIC BILATERAL MAMMOGRAM WITH TOMOSYNTHESIS AND CAD; ULTRASOUND RIGHT BREAST LIMITED; ULTRASOUND LEFT BREAST LIMITED TECHNIQUE: Bilateral digital diagnostic mammography and breast tomosynthesis was performed. The images were evaluated with computer-aided detection. ; Targeted ultrasound examination of the right breast was performed; Targeted ultrasound examination of the left breast was performed. COMPARISON:  Previous exam(s). ACR Breast Density Category c: The breasts are heterogeneously dense, which may obscure small masses. FINDINGS: In the RIGHT upper slightly inner breast at middle depth, there is an area of subtle architectural distortion. It is best seen on CC slice 41, ML slice 28, spot MLO slice 28, MLO slice 31. It becomes less conspicuous with spot CC images but is favored to subtly persist on spot CC slice 30. Diagnostic images of the LEFT breast demonstrate mammographic stability of an oval mass with associated COIL biopsy marking clip. This is consistent with benign biopsy proven fibroadenoma. Spot magnification views of the LEFT breast demonstrate a new group of branching and punctate calcifications in the upper outer breast at posterior depth. These span approximately 5 mm. On physical exam, no suspicious mass is appreciated. Targeted ultrasound was performed of the RIGHT breast. At 1 o'clock 6 cm from nipple, there is an irregular hypoechoic mass with indistinct margins. This measures 6 x 7 x 4 mm. This is favored to correspond to the area of architectural distortion. Targeted ultrasound was performed of the RIGHT axilla. No suspicious axillary lymph nodes are  visualized. Targeted ultrasound was performed of the LEFT breast. At 2 o'clock 3 cm from the nipple there is revisualization of an oval circumscribed hypoechoic mass. It measures 12 x 6 by 12 mm, significantly changed comparison to prior. It demonstrates an associated echogenic focus consistent with COIL biopsy marking clip is consistent with a benign biopsy proven fibroadenoma. Adjacent mass at 1 o'clock 3 cm from the nipple is no longer visualized. At 3 o'clock 7 cm from the nipple there is an oval circumscribed anechoic mass with posterior acoustic enhancement. This measures 8 by 8 x 5 mm and is consistent with a benign cyst. Today falling at 2-2:30 7 cm from the nipple but labeled as 3 o'clock 7 cm from the nipple for consistency purposes, there is revisualization of an oval circumscribed hypoechoic mass. It measures 4 x 3 x 4  mm, unchanged in comparison to prior. IMPRESSION: 1. In the RIGHT breast, there is a subtle area of architectural distortion with a favored 7 mm sonographic correlate at 1 o'clock. Recommend ultrasound-guided biopsy for definitive characterization. Recommend attention on post marker placement mammogram to assess for mammographic/sonographic correlation. 2. There is a 5 mm group of indeterminate calcifications in the LEFT breast. Recommend stereotactic guided biopsy for definitive characterization. 3. Unchanged probably benign LEFT 4 mm breast mass at 3 o'clock 7 cm from the nipple. Option for continued follow-up versus definitive characterization with biopsy was discussed with patient. Patient would prefer to proceed with definitive characterization at this point in time. As such, recommend ultrasound-guided biopsy for definitive characterization. 4. 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 Interval resolution of the second adjacent mass, consistent with a benign etiology. 5. Additional benign LEFT breast cyst is noted at 3 o'clock 7 cm from the nipple. 6. No  suspicious RIGHT axillary adenopathy. 7. Given history of atypical lobular hyperplasia, recommend consideration of supplemental screening with breast MRI with and without contrast. RECOMMENDATION: 1. RIGHT breast ultrasound-guided biopsy x1 2. LEFT breast ultrasound-guided biopsy x1 3. LEFT breast stereotactic guided biopsy x1 I have discussed the findings and recommendations with the patient. The biopsy procedure was discussed with the patient and questions were answered. Patient expressed their understanding of the biopsy recommendation. Patient will be scheduled for biopsy at her earliest convenience by the schedulers. Ordering provider will be notified. If applicable, a reminder letter will be sent to the patient regarding the next appointment. BI-RADS CATEGORY  4: Suspicious. Electronically Signed: By: Corean Salter M.D. On: 10/08/2023 16:27    ASSESSMENT & PLAN:   Carcinoma of upper-inner quadrant of right breast in female, estrogen receptor positive (HCC) Yes # Breast, right, needle core biopsy, 1 o'clock- INVASIVE MAMMARY CARCINOMA, NO SPECIAL TYPE (DUCTAL); grade 1    CINOMA IN SITU: PRESENT, INTERMEDIATE GRADE; # LEFT BREAST -atypical lobular hyperplasia.   # 2025- JUNE RIGHT  BREAST clinical stage I [T2] cN0; [id number]-100; PR-95%; her 2 NEG Her 2 NEG. Surgeon: Dr.Rodenberg: discussed re: need for MRI.    # I had a long discussion with the patient in general regarding the treatment options of breast cancer including-surgery; adjuvant radiation; role of adjuvant systemic therapy including-chemotherapy antihormone therapy.    # Discussed surgical options-sentinel lymph node biopsy with lumpectomy versus mastectomy.  Discussed that lumpectomy is usually followed by adjuvant radiation.  Whereas mastectomy typically does not indicate radiation.   # Discussed that given patient's presurgical clinical characteristics LESS likely that she will need chemotherapy.  However final decision regarding  chemotherapy based on final surgical pathology/gene assay.  Will order Oncotype after surgery.   #I discussed the role of endocrine therapy-given ER/PR positive disease postsurgery.  Patient will be offered antihormone pill 1 a day for 5 years.  Discussed potential downsides including but not limited to arthralgias hot flashes.  Will order bone density.  # Genetics counseling: I at length discussed with the patient that majority of cancers are sporadic however 10 to 20% at risk of genetic/hereditary cancer syndromes.  Discussed importance of genetic counseling/genetic testing-given the therapeutic/preventative aspects.  Patient interested in genetic counseling.  Referral to genetic counselling re: cancer.   Thank you  Dr.Anderson MD for allowing me to participate in the care of your pleasant patient. Please do not hesitate to contact me with questions or concerns in the interim. Follow up in 2-3 weeks after surgery. Discussed with Therisa; and  also discussed with surgery, Dr.Rodenberg.   # DISPOSITION: # refer to Genetics re: breast cancer/ mat aunt- breast cancer # no labs today # Follow up TBD- Dr.B   All questions were answered. The patient/family knows to call the clinic with any problems, questions or concerns.      Cindy JONELLE Joe, MD 10/28/2023 11:24 AM

## 2023-10-27 NOTE — Progress Notes (Unsigned)
 C/o pulling left breast side, bx's done last week both breast, 2/10. Mammogram 10/19/23.

## 2023-10-28 ENCOUNTER — Inpatient Hospital Stay: Admitting: Occupational Therapy

## 2023-10-28 DIAGNOSIS — Z170 Estrogen receptor positive status [ER+]: Secondary | ICD-10-CM

## 2023-10-28 DIAGNOSIS — R293 Abnormal posture: Secondary | ICD-10-CM

## 2023-10-28 DIAGNOSIS — C50211 Malignant neoplasm of upper-inner quadrant of right female breast: Secondary | ICD-10-CM | POA: Diagnosis not present

## 2023-10-28 NOTE — Therapy (Signed)
 OUTPATIENT OCCUPATIONAL THERAPY BREAST CANCER BASELINE EVALUATION   Patient Name: Yolanda Gill MRN: [id number] DOB:09-10-1970, 53 y.o., female Today's Date: 10/28/2023  END OF SESSION:  OT End of Session - 10/28/23 1246     Visit Number 1    Number of Visits 5    Date for OT Re-Evaluation 01/20/24    OT Start Time 0833    OT Stop Time 0858    OT Time Calculation (min) 25 min    Activity Tolerance Patient tolerated treatment well    Behavior During Therapy [facility] for tasks assessed/performed          Past Medical History:  Diagnosis Date   Hypertension    Hypothyroidism    Thyroid disease    Past Surgical History:  Procedure Laterality Date   BREAST BIOPSY Left 08/2022   Atypical lobular hyperplasia   BREAST BIOPSY Bilateral 10/19/2023   2 u/s bx lt (ribbon) rt ( ) one stereo (x) clip   BREAST BIOPSY Right 10/19/2023   US  RT BREAST BX W LOC DEV 1ST LESION IMG BX SPEC US  GUIDE 10/19/2023 [facility]   BREAST BIOPSY Left 10/19/2023   MM LT BREAST BX W LOC DEV 1ST LESION IMAGE BX SPEC STEREO GUIDE 10/19/2023 [facility]   BREAST BIOPSY Left 10/19/2023   US  LT BREAST BX W LOC DEV EA ADD LESION IMG BX SPEC US  GUIDE 10/19/2023 [facility]   BREAST SURGERY Left 10/31/2013   Radial scar without atypia   FINE NEEDLE ASPIRATION Left    when she was in college - left breast   WISDOM TOOTH EXTRACTION  05/05/1988   Patient Active Problem List   Diagnosis Date Noted   Carcinoma of upper-inner quadrant of right breast in female, estrogen receptor positive (HCC) 10/27/2023   At high risk for breast cancer 09/09/2022   Radial scar of breast 11/07/2013    PCP: DR Lenon MART PROVIDER: Dr Rennie  REFERRING DIAG: R breast Cancer  THERAPY DIAG:  Carcinoma of upper-inner quadrant of right breast in female, estrogen receptor positive (HCC)  Abnormal posture  Rationale for Evaluation and Treatment: Rehabilitation  ONSET DATE: June 25  SUBJECTIVE:                                                                                                                                                                                            SUBJECTIVE STATEMENT: Patient reports she is here today after being refer by one of her medical team for her newly diagnosed right breast cancer.   PERTINENT HISTORY:  Patient was diagnosed with right  breast cancer - plan is to have modify  mastectomy/lumpectomy- meeting with surgeon tomorrow to discuss plan.  PATIENT GOALS:   reduce lymphedema risk and learn post op HEP.   PAIN:  Are you having pain? NO  PRECAUTIONS: Active CA      HAND DOMINANCE: right  WEIGHT BEARING RESTRICTIONS: No  FALLS:  Has patient fallen in last 6 months? 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     OBJECTIVE:  COGNITION: Overall cognitive status: Within functional limits for tasks assessed    POSTURE:  Forward head and rounded shoulders posture  UPPER EXTREMITY AROM/PROM:  A/PROM RIGHT   eval   Shoulder extension   Shoulder flexion   Shoulder abduction   Shoulder internal rotation   Shoulder external rotation     (Blank rows = not tested)  A/PROM LEFT   eval  Shoulder extension   Shoulder flexion   Shoulder abduction   Shoulder internal rotation   Shoulder external rotation     (Blank rows = not tested)  CERVICAL AROM: All within normal limits:    Percent limited  Flexion   Extension   Right lateral flexion   Left lateral flexion   Right rotation   Left rotation     UPPER EXTREMITY STRENGTH: Bilateral upper extremity strength within normal limits 5/5 with external or internal rotation 5 -/5  LYMPHEDEMA ASSESSMENTS:   LYMPHEDEMA/ONCOLOGY QUESTIONNAIRE - 10/28/23 0001       Right Upper Extremity Lymphedema   15 cm Proximal to Olecranon Process 31.5 cm    10 cm Proximal to Olecranon  Process 28.8 cm    Olecranon Process 25 cm      Left Upper Extremity Lymphedema   15 cm Proximal to Olecranon Process 33 cm    10 cm Proximal to Olecranon Process 29.2 cm    Olecranon Process 24.5 cm           L-DEX LYMPHEDEMA SCREENING:  The patient was assessed using the L-Dex machine today to produce a lymphedema index baseline score. The patient will be reassessed on a regular basis (typically every 3 months) to obtain new L-Dex scores. If the score is > 6.5 points away from his/her baseline score indicating onset of subclinical lymphedema, it will be recommended to wear a compression garment for 4 weeks, 12 hours per day and then be reassessed. If the score continues to be > 6.5 points from baseline at reassessment, we will initiate lymphedema treatment. Assessing in this manner has a 95% rate of preventing clinically significant lymphedema.   L-DEX FLOWSHEETS - 10/28/23 1200       L-DEX LYMPHEDEMA SCREENING   Measurement Type Unilateral    L-DEX MEASUREMENT EXTREMITY Upper Extremity    POSITION  Seated    DOMINANT SIDE Right    At Risk Side Right    BASELINE SCORE (UNILATERAL) 1.8           PATIENT EDUCATION:  Education details: Lymphedema risk reduction will be done after surgery and post op shoulder/posture HEP done Person educated: Patient Education method: Explanation, Demonstration, Handout Education comprehension: Patient verbalized understanding and returned demonstration  HOME EXERCISE PROGRAM: Patient was instructed today in a home exercise program today for post op shoulder range of motion. These included active assist shoulder flexion and ABD in supine  as well as external rotation in supine - hands behind head - can start now scapular retraction,  She was  encouraged to do these 2-3 x day, holding 3 seconds and repeating 10 times when permitted by her physician/surgeon.   ASSESSMENT:  CLINICAL IMPRESSION: Her multidisciplinary medical team has met to  assess and determine a recommended treatment plan. She is planning to meet with surgeon tomorrow-  She will benefit from a post op OT reassessment to determine needs and from L-Dex screens every 3 months for 2 years to detect subclinical lymphedema.  Pt will benefit from skilled therapeutic intervention to improve on the following deficits: Decreased knowledge of precautions and lymphedema education, impaired UE functional use, pain, decreased ROM, postural dysfunction.   OT treatment/interventions: ADL/self-care home management, pt/family education, therapeutic exercise,manual therapy  REHAB POTENTIAL: Good  CLINICAL DECISION MAKING: Stable/uncomplicated  EVALUATION COMPLEXITY: Low   GOALS: Goals reviewed with patient? YES  LONG TERM GOALS: (STG=LTG)    Name Target Date Goal status  1 Pt will be able to verbalize understanding of pertinent lymphedema risk reduction practices relevant to her dx specifically related to skin care.  Baseline:  No knowledge 8 wks Initial   2 Pt will be able to return demo and/or verbalize understanding of the post op HEP related to regaining shoulder ROM. Baseline:  No knowledge today Achieved at eval       4 Pt will demo she has regained full shoulder ROM and function post operatively compared to baselines.  Baseline: See objective measurements taken today. 8 wks Initial    PLAN:  OT FREQUENCY/DURATION: EVAL and 3 follow up appointment.   PLAN FOR NEXT SESSION: will reassess 3-4 weeks post op to determine needs.   Patient will follow up at outpatient cancer rehab 3-4 weeks following surgery.  If the patient requires occupational therapy at that time, a specific plan will be dictated and sent to the referring physician for approval. T Occupational Therapy Information for After Breast Cancer Surgery/Treatment:  Lymphedema is a swelling condition that you may be at risk for in your arm if you have lymph nodes removed from the armpit area.  After a  sentinel node biopsy, the risk is approximately 5-9% and is higher after an axillary node dissection.  There is treatment available for this condition and it is not life-threatening.  Contact your physician or occupational therapist with concerns. You may begin the 4 shoulder/posture exercises (see additional sheet) when permitted by your physician (typically a week after surgery).  If you have drains, you may need to wait until those are removed before beginning range of motion exercises.  A general recommendation is to not lift your arms above shoulder height until drains are removed.  These exercises should be done to your tolerance and gently.  This is not a no pain/no gain type of recovery so listen to your body and stretch into the range of motion that you can tolerate, stopping if you have pain.  If you are having immediate reconstruction, ask your plastic surgeon about doing exercises as he or she may want you to wait. .  While undergoing any medical procedure or treatment, try to avoid blood pressure being taken or needle sticks from occurring on the arm on the side of cancer.   This recommendation begins after surgery and continues for the rest of your life.  This may help reduce your risk of getting lymphedema (swelling in your arm). An excellent resource for those seeking information on lymphedema is the National L[employer]hed[employer] Network's web site. It can be accessed at www.lymphnet.org If you notice swelling in your hand,  arm or breast at any time following surgery (even if it is many years from now), please contact your doctor or occupational therapist to discuss this.  Lymphedema can be treated at any time but it is easier for you if it is treated early on.  If you feel like your shoulder motion is not returning to normal in a reasonable amount of time, please contact your surgeon or occupational therapist.  [facility] [phone]. [address] [place], [place]  [phone]  Patient was instructed today in a home exercise program today for post op shoulder range of motion. These included active assist shoulder flexion in standing/supine, scapular retraction, wall walking/slides with shoulder abduction, and hands behind head external rotation in supine.  She was encouraged to do these 2-3 x day, holding 3 seconds and repeating 10 times when permitted by her physician/surgeon      Ancel Peters, OTR/L,CLT 10/28/2023, 12:54 PM

## 2023-10-29 ENCOUNTER — Ambulatory Visit: Admitting: Surgery

## 2023-10-29 ENCOUNTER — Encounter: Payer: Self-pay | Admitting: Surgery

## 2023-10-29 ENCOUNTER — Other Ambulatory Visit: Payer: Self-pay

## 2023-10-29 VITALS — BP 126/84 | HR 81 | Temp 98.2°F | Ht 68.0 in | Wt 187.6 lb

## 2023-10-29 DIAGNOSIS — C50911 Malignant neoplasm of unspecified site of right female breast: Secondary | ICD-10-CM

## 2023-10-29 DIAGNOSIS — Z170 Estrogen receptor positive status [ER+]: Secondary | ICD-10-CM | POA: Diagnosis not present

## 2023-10-29 DIAGNOSIS — C50211 Malignant neoplasm of upper-inner quadrant of right female breast: Secondary | ICD-10-CM | POA: Diagnosis not present

## 2023-10-29 MED ORDER — LIDOCAINE-PRILOCAINE 2.5-2.5 % EX CREA: TOPICAL_CREAM | CUTANEOUS | 0 refills | Status: DC

## 2023-10-29 NOTE — H&P (View-Only) (Signed)
 Patient ID: Yolanda Gill, female   DOB: 1971-03-23, 53 y.o.   MRN: [id number]  Chief Complaint:  Right breast cancer.   History of Present Illness Yolanda Gill is a 53 y.o. 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  She had a history of ALH previously biopsied in the left breast and was being surveilled for this.  She has never noticed a breast lump, skin changes or dimpling, nipple retraction or nipple discharge.  She took birth control, she is currently perimenopausal.  Her family history involves an aunt.  Began menstruating at age 57.  She is gravida 4 para 4.  She was 53 years old with her first pregnancy.  She has undergone screening imaging, more frequently since her diagnosis of atypical lobular hyperplasia.  She opted to not utilize antiestrogen medications, considering the side effects.   Past Medical History Past Medical History:  Diagnosis Date   Hypertension    Hypothyroidism    Thyroid disease       Past Surgical History:  Procedure Laterality Date   BREAST BIOPSY Left 08/2022   Atypical lobular hyperplasia   BREAST BIOPSY Bilateral 10/19/2023   2 u/s bx lt (ribbon) rt ( ) one stereo (x) clip   BREAST BIOPSY Right 10/19/2023   US  RT BREAST BX W LOC DEV 1ST LESION IMG BX SPEC US  GUIDE 10/19/2023 [facility]   BREAST BIOPSY Left 10/19/2023   MM LT BREAST BX W LOC DEV 1ST LESION IMAGE BX SPEC STEREO GUIDE 10/19/2023 [facility]   BREAST BIOPSY Left 10/19/2023   US  LT BREAST BX W LOC DEV EA ADD LESION IMG BX SPEC US  GUIDE 10/19/2023 [facility]   BREAST SURGERY Left 10/31/2013   Radial scar without atypia   FINE NEEDLE ASPIRATION Left    when she was in college - left breast   WISDOM TOOTH EXTRACTION  05/05/1988    No Known Allergies  Current Outpatient Medications  Medication Sig Dispense  Refill   cetirizine (ZYRTEC) 10 MG tablet Take by mouth.     levothyroxine (SYNTHROID, LEVOTHROID) 75 MCG tablet Take 1 tablet by mouth daily.     lidocaine -prilocaine  (EMLA ) cream Apply to L/R areola of the breast and cover with plastic wrap one hour prior to leaving for surgery. 5 g 0   losartan-hydrochlorothiazide (HYZAAR) 100-12.5 MG tablet Take 1 tablet by mouth daily.     No current facility-administered medications for this visit.    Family History Family History  Problem Relation Age of Onset   Hypertension Mother    Thyroid disease Mother    Hypertension Father    Breast cancer Maternal Aunt    Cancer Maternal Aunt        Breast Cancer       Social History Social History   Tobacco Use   Smoking status: Never   Smokeless tobacco: Never  Substance Use Topics   Alcohol use: No   Drug use: No        Review of Systems  Constitutional: Negative.   HENT: Negative.    Eyes: Negative.   Respiratory: Negative.    Cardiovascular: Negative.   Gastrointestinal: Negative.   Genitourinary: Negative.   Skin: Negative.   Neurological: Negative.   Psychiatric/Behavioral: Negative.       Physical Exam Blood pressure 126/84, pulse 81, temperature 98.2 F (36.8 C), temperature source Oral, height  5' 8 (1.727 m), weight 187 lb 9.6 oz (85.1 kg), last menstrual period 09/30/2023, SpO2 96%. Last Weight  Most recent update: 10/29/2023  1:38 PM    Weight  85.1 kg (187 lb 9.6 oz)             CONSTITUTIONAL: Well developed, and nourished, appropriately responsive and aware without distress.   EYES: Sclera non-icteric.   EARS, NOSE, MOUTH AND THROAT:  The oropharynx is clear. Oral mucosa is pink and moist.   Hearing is intact to voice.  NECK: Trachea is midline, and there is no jugular venous distension.  LYMPH NODES:  Lymph nodes in the neck are not appreciated. RESPIRATORY:  Lungs are clear, and breath sounds are equal bilaterally.  Normal respiratory effort without  pathologic use of accessory muscles. CARDIOVASCULAR: Heart is regular in rate and rhythm.   Well perfused.  GI: The abdomen is  soft, nontender, and nondistended. There were no palpable masses.  I did not appreciate hepatosplenomegaly. MUSCULOSKELETAL:  Symmetrical muscle tone appreciated in all four extremities.    SKIN: Skin turgor is normal. No pathologic skin lesions appreciated.  NEUROLOGIC:  Motor and sensation appear grossly normal.  Cranial nerves are grossly without defect. PSYCH:  Alert and oriented to person, place and time. Affect is appropriate for situation.  Data Reviewed I have personally reviewed what is currently available of the patient's imaging, recent labs and medical records.   Labs:      No data to display             No data to display         REPORT OF SURGICAL PATHOLOGY  Received Date: 10/19/2023  FINAL DIAGNOSIS       1. Breast, right, needle core biopsy, 1 o'clock, 6cmfn, ribbon clip :      - INVASIVE MAMMARY CARCINOMA, NO SPECIAL TYPE (DUCTAL).      - TUBULE FORMATION: SCORE 1      - NUCLEAR PLEOMORPHISM: SCORE 2      - MITOTIC COUNT: SCORE 1      - TOTAL SCORE: 4      - OVERALL GRADE: 1      - LYMPHOVASCULAR INVASION: NOT IDENTIFIED      - CANCER LENGTH: 7 MM      - CALCIFICATIONS: PRESENT      - DUCTAL CARCINOMA IN SITU: PRESENT, INTERMEDIATE GRADE      - SEE NOTE.       2. Breast, left, needle core biopsy, 3 o'clock, 7cmfn, heart clip :      - PARTIAL SAMPLING OF SIMPLE CYST WALL.      - ADJACENT BREAST TISSUE WITH ATYPICAL LOBULAR HYPERPLASIA, APOCRINE METAPLASIA,      COLUMNAR CELL CHANGE, AND PSEUDOANGIOMATOUS STROMAL HYPERPLASIA.       3. Breast, left, needle core biopsy, upper, outer quadrant, posterior, x clip :      - COLUMNAR CELL CHANGE WITH CALCIFICATION.      - SMALL CYST FORMATION.      - NEGATIVE FOR ATYPIA AND MALIGNANCY.   ADDENDUM Breast, right, needle core biopsy PROGNOSTIC  INDICATORS Results: IMMUNOHISTOCHEMICAL AND MORPHOMETRIC ANALYSIS PERFORMED MANUALLY The tumor cells are negative for Her2 (1+). Estrogen Receptor:  100%, positive, strong staining intensity Progesterone Receptor:  95%, positive, strong staining intensity COMMENT:  The negative hormone receptor study(ies) in this case has an internal positive control.  CLINICAL HISTORY  SPECIMEN(S) OBTAINED 1. Breast, right, needle core biopsy, 1 O'clock, 6cmfn, Ribbon Clip  Imaging: Radiological images reviewed:   Study Result  Narrative & Impression  CLINICAL DATA:  Patient is status post ultrasound-guided biopsy on April 2024 which demonstrated atypical lobular hyperplasia and fibroadenoma (1 o'clock 3 cm from the nipple). Sampling was performed of the adjacent mass at 2 o'clock 3 cm the nipple at same time. Surgical excision was deferred. It is not documented which mass the COIL clip was placed into but is favored to be within the mass at 2 o'clock. Patient presents for short-term follow-up. Review of prior sonographic images demonstrated an additional probably benign mass at 3 o'clock 7 cm from the nipple. The mass at 1 o'clock 3 cm from the nipple was less sonographically discrete on a recent November 2024 diagnostic workup. Patient is due for RIGHT breast screening.   EXAM: DIGITAL DIAGNOSTIC BILATERAL MAMMOGRAM WITH TOMOSYNTHESIS AND CAD; ULTRASOUND RIGHT BREAST LIMITED; ULTRASOUND LEFT BREAST LIMITED   TECHNIQUE: Bilateral digital diagnostic mammography and breast tomosynthesis was performed. The images were evaluated with computer-aided detection. ; Targeted ultrasound examination of the right breast was performed; Targeted ultrasound examination of the left breast was performed.   COMPARISON:  Previous exam(s).   ACR Breast Density Category c: The breasts are heterogeneously dense, which may obscure small masses.   FINDINGS: In the RIGHT upper slightly inner breast at  middle depth, there is an area of subtle architectural distortion. It is best seen on CC slice 41, ML slice 28, spot MLO slice 28, MLO slice 31. It becomes less conspicuous with spot CC images but is favored to subtly persist on spot CC slice 30.   Diagnostic images of the LEFT breast demonstrate mammographic stability of an oval mass with associated COIL biopsy marking clip. This is consistent with benign biopsy proven fibroadenoma. Spot magnification views of the LEFT breast demonstrate a new group of branching and punctate calcifications in the upper outer breast at posterior depth. These span approximately 5 mm.   On physical exam, no suspicious mass is appreciated.   Targeted ultrasound was performed of the RIGHT breast. At 1 o'clock 6 cm from nipple, there is an irregular hypoechoic mass with indistinct margins. This measures 6 x 7 x 4 mm. This is favored to correspond to the area of architectural distortion.   Targeted ultrasound was performed of the RIGHT axilla. No suspicious axillary lymph nodes are visualized.   Targeted ultrasound was performed of the LEFT breast.  CORRECTED, as per addendum below: At 2 o'clock 3 cm from the nipple there is revisualization of an oval circumscribed hypoechoic mass. It measures 12 x 6 by 12 mm, NOT significantly changed comparison to prior. It demonstrates an associated echogenic focus consistent with COIL biopsy marking clip AND is consistent with a benign biopsy proven fibroadenoma. Adjacent mass at 1 o'clock 3 cm from the nipple is no longer visualized. is consistent with a benign biopsy proven fibroadenoma.    At 3 o'clock 7 cm from the nipple there is an oval circumscribed anechoic mass with posterior acoustic enhancement. This measures 8 by 8 x 5 mm and is consistent with a benign cyst. Today falling at 2-2:30 7 cm from the nipple but labeled as 3 o'clock 7 cm from the nipple for consistency purposes, there is revisualization of  an oval circumscribed hypoechoic mass. It measures 4 x 3 x 4 mm, unchanged in comparison to prior.   IMPRESSION: 1. In the RIGHT breast, there is a subtle area of architectural distortion with a favored 7 mm sonographic correlate at  1 o'clock. Recommend ultrasound-guided biopsy for definitive characterization. Recommend attention on post marker placement mammogram to assess for mammographic/sonographic correlation. 2. There is a 5 mm group of indeterminate calcifications in the LEFT breast. Recommend stereotactic guided biopsy for definitive characterization. 3. Unchanged probably benign LEFT 4 mm breast mass at 3 o'clock 7 cm from the nipple. Option for continued follow-up versus definitive characterization with biopsy was discussed with patient. Patient would prefer to proceed with definitive characterization at this point in time. As such, recommend ultrasound-guided biopsy for definitive characterization. 4. 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 Interval resolution of the second adjacent mass, consistent with a benign etiology. 5. Additional benign LEFT breast cyst is noted at 3 o'clock 7 cm from the nipple. 6. No suspicious RIGHT axillary adenopathy. 7. Given history of atypical lobular hyperplasia, recommend consideration of supplemental screening with breast MRI with and without contrast.   RECOMMENDATION: 1. RIGHT breast ultrasound-guided biopsy x1 2. LEFT breast ultrasound-guided biopsy x1 3. LEFT breast stereotactic guided biopsy x1   BI-RADS CATEGORY  4: Suspicious.   Electronically Signed: By: Corean Salter M.D. On: 10/08/2023 16:27  ADDENDUM REPORT: 10/16/2023 16:14   ADDENDUM: Transcription error: At 2 o'clock 3 cm from the nipple there is revisualization of an oval circumscribed hypoechoic mass. It measures 12 x 6 by 12 mm, NOT significantly changed comparison to prior. It demonstrates an associated echogenic focus consistent  with COIL biopsy marking clip AND is consistent with a benign biopsy proven fibroadenoma. Adjacent mass at 1 o'clock 3 cm from the nipple is no longer visualized.     Electronically Signed   By: Corean Salter M.D.   On: 10/16/2023 16:14   Within last 24 hrs: No results found.  Assessment     Patient Active Problem List   Diagnosis Date Noted   Carcinoma of upper-inner quadrant of right breast in female, estrogen receptor positive (HCC) 10/27/2023   At high risk for breast cancer 09/09/2022   Radial scar of breast 11/07/2013    Plan    I discussed the available options with the patient. The risk of recurrence is similar between mastectomy and lumpectomy with radiation.  I also discussed that given the small size of the cancer would recommend localization lumpectomy with radiation to follow.  I also discussed that we would need to do a sentinel lymph node biopsy to check the nodes.   Explained to the patient that after her surgical treatment additional treatment will depend on her prognostic indicators and stage.   Options reviewed extensively, along with potential of referral for immediate reconstruction should she prefer a more aggressive surgical approach.   I discussed risks of bleeding, infection, damage to surrounding tissues, having positive margins, needing further resection, damage to nerves causing arm numbness or difficulty raising arm, causing lymphedema in the arm; as well as anesthesia risks.   Patient was given the opportunity to ask questions and have them answered.  They would like to proceed with right breast Scout tag localized lumpectomy with sentinel lymph node biopsy.      I personally spent a total of 60 minutes in the care of the patient today including preparing to see the patient, performing a medically appropriate exam/evaluation, counseling and educating, placing orders, documenting clinical information in the EHR, and independently interpreting  results.   These notes generated with voice recognition software. I apologize for typographical errors.  [name] M.D., FACS 10/29/2023, 2:19 PM

## 2023-10-29 NOTE — Progress Notes (Signed)
 Patient ID: Yolanda Gill, female   DOB: 1971-03-23, 53 y.o.   MRN: [id number]  Chief Complaint:  Right breast cancer.   History of Present Illness Yolanda Gill is a 53 y.o. 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  She had a history of ALH previously biopsied in the left breast and was being surveilled for this.  She has never noticed a breast lump, skin changes or dimpling, nipple retraction or nipple discharge.  She took birth control, she is currently perimenopausal.  Her family history involves an aunt.  Began menstruating at age 57.  She is gravida 4 para 4.  She was 53 years old with her first pregnancy.  She has undergone screening imaging, more frequently since her diagnosis of atypical lobular hyperplasia.  She opted to not utilize antiestrogen medications, considering the side effects.   Past Medical History Past Medical History:  Diagnosis Date   Hypertension    Hypothyroidism    Thyroid disease       Past Surgical History:  Procedure Laterality Date   BREAST BIOPSY Left 08/2022   Atypical lobular hyperplasia   BREAST BIOPSY Bilateral 10/19/2023   2 u/s bx lt (ribbon) rt ( ) one stereo (x) clip   BREAST BIOPSY Right 10/19/2023   US  RT BREAST BX W LOC DEV 1ST LESION IMG BX SPEC US  GUIDE 10/19/2023 [facility]   BREAST BIOPSY Left 10/19/2023   MM LT BREAST BX W LOC DEV 1ST LESION IMAGE BX SPEC STEREO GUIDE 10/19/2023 [facility]   BREAST BIOPSY Left 10/19/2023   US  LT BREAST BX W LOC DEV EA ADD LESION IMG BX SPEC US  GUIDE 10/19/2023 [facility]   BREAST SURGERY Left 10/31/2013   Radial scar without atypia   FINE NEEDLE ASPIRATION Left    when she was in college - left breast   WISDOM TOOTH EXTRACTION  05/05/1988    No Known Allergies  Current Outpatient Medications  Medication Sig Dispense  Refill   cetirizine (ZYRTEC) 10 MG tablet Take by mouth.     levothyroxine (SYNTHROID, LEVOTHROID) 75 MCG tablet Take 1 tablet by mouth daily.     lidocaine -prilocaine  (EMLA ) cream Apply to L/R areola of the breast and cover with plastic wrap one hour prior to leaving for surgery. 5 g 0   losartan-hydrochlorothiazide (HYZAAR) 100-12.5 MG tablet Take 1 tablet by mouth daily.     No current facility-administered medications for this visit.    Family History Family History  Problem Relation Age of Onset   Hypertension Mother    Thyroid disease Mother    Hypertension Father    Breast cancer Maternal Aunt    Cancer Maternal Aunt        Breast Cancer       Social History Social History   Tobacco Use   Smoking status: Never   Smokeless tobacco: Never  Substance Use Topics   Alcohol use: No   Drug use: No        Review of Systems  Constitutional: Negative.   HENT: Negative.    Eyes: Negative.   Respiratory: Negative.    Cardiovascular: Negative.   Gastrointestinal: Negative.   Genitourinary: Negative.   Skin: Negative.   Neurological: Negative.   Psychiatric/Behavioral: Negative.       Physical Exam Blood pressure 126/84, pulse 81, temperature 98.2 F (36.8 C), temperature source Oral, height  5' 8 (1.727 m), weight 187 lb 9.6 oz (85.1 kg), last menstrual period 09/30/2023, SpO2 96%. Last Weight  Most recent update: 10/29/2023  1:38 PM    Weight  85.1 kg (187 lb 9.6 oz)             CONSTITUTIONAL: Well developed, and nourished, appropriately responsive and aware without distress.   EYES: Sclera non-icteric.   EARS, NOSE, MOUTH AND THROAT:  The oropharynx is clear. Oral mucosa is pink and moist.   Hearing is intact to voice.  NECK: Trachea is midline, and there is no jugular venous distension.  LYMPH NODES:  Lymph nodes in the neck are not appreciated. RESPIRATORY:  Lungs are clear, and breath sounds are equal bilaterally.  Normal respiratory effort without  pathologic use of accessory muscles. CARDIOVASCULAR: Heart is regular in rate and rhythm.   Well perfused.  GI: The abdomen is  soft, nontender, and nondistended. There were no palpable masses.  I did not appreciate hepatosplenomegaly. MUSCULOSKELETAL:  Symmetrical muscle tone appreciated in all four extremities.    SKIN: Skin turgor is normal. No pathologic skin lesions appreciated.  NEUROLOGIC:  Motor and sensation appear grossly normal.  Cranial nerves are grossly without defect. PSYCH:  Alert and oriented to person, place and time. Affect is appropriate for situation.  Data Reviewed I have personally reviewed what is currently available of the patient's imaging, recent labs and medical records.   Labs:      No data to display             No data to display         REPORT OF SURGICAL PATHOLOGY  Received Date: 10/19/2023  FINAL DIAGNOSIS       1. Breast, right, needle core biopsy, 1 o'clock, 6cmfn, ribbon clip :      - INVASIVE MAMMARY CARCINOMA, NO SPECIAL TYPE (DUCTAL).      - TUBULE FORMATION: SCORE 1      - NUCLEAR PLEOMORPHISM: SCORE 2      - MITOTIC COUNT: SCORE 1      - TOTAL SCORE: 4      - OVERALL GRADE: 1      - LYMPHOVASCULAR INVASION: NOT IDENTIFIED      - CANCER LENGTH: 7 MM      - CALCIFICATIONS: PRESENT      - DUCTAL CARCINOMA IN SITU: PRESENT, INTERMEDIATE GRADE      - SEE NOTE.       2. Breast, left, needle core biopsy, 3 o'clock, 7cmfn, heart clip :      - PARTIAL SAMPLING OF SIMPLE CYST WALL.      - ADJACENT BREAST TISSUE WITH ATYPICAL LOBULAR HYPERPLASIA, APOCRINE METAPLASIA,      COLUMNAR CELL CHANGE, AND PSEUDOANGIOMATOUS STROMAL HYPERPLASIA.       3. Breast, left, needle core biopsy, upper, outer quadrant, posterior, x clip :      - COLUMNAR CELL CHANGE WITH CALCIFICATION.      - SMALL CYST FORMATION.      - NEGATIVE FOR ATYPIA AND MALIGNANCY.   ADDENDUM Breast, right, needle core biopsy PROGNOSTIC  INDICATORS Results: IMMUNOHISTOCHEMICAL AND MORPHOMETRIC ANALYSIS PERFORMED MANUALLY The tumor cells are negative for Her2 (1+). Estrogen Receptor:  100%, positive, strong staining intensity Progesterone Receptor:  95%, positive, strong staining intensity COMMENT:  The negative hormone receptor study(ies) in this case has an internal positive control.  CLINICAL HISTORY  SPECIMEN(S) OBTAINED 1. Breast, right, needle core biopsy, 1 O'clock, 6cmfn, Ribbon Clip  Imaging: Radiological images reviewed:   Study Result  Narrative & Impression  CLINICAL DATA:  Patient is status post ultrasound-guided biopsy on April 2024 which demonstrated atypical lobular hyperplasia and fibroadenoma (1 o'clock 3 cm from the nipple). Sampling was performed of the adjacent mass at 2 o'clock 3 cm the nipple at same time. Surgical excision was deferred. It is not documented which mass the COIL clip was placed into but is favored to be within the mass at 2 o'clock. Patient presents for short-term follow-up. Review of prior sonographic images demonstrated an additional probably benign mass at 3 o'clock 7 cm from the nipple. The mass at 1 o'clock 3 cm from the nipple was less sonographically discrete on a recent November 2024 diagnostic workup. Patient is due for RIGHT breast screening.   EXAM: DIGITAL DIAGNOSTIC BILATERAL MAMMOGRAM WITH TOMOSYNTHESIS AND CAD; ULTRASOUND RIGHT BREAST LIMITED; ULTRASOUND LEFT BREAST LIMITED   TECHNIQUE: Bilateral digital diagnostic mammography and breast tomosynthesis was performed. The images were evaluated with computer-aided detection. ; Targeted ultrasound examination of the right breast was performed; Targeted ultrasound examination of the left breast was performed.   COMPARISON:  Previous exam(s).   ACR Breast Density Category c: The breasts are heterogeneously dense, which may obscure small masses.   FINDINGS: In the RIGHT upper slightly inner breast at  middle depth, there is an area of subtle architectural distortion. It is best seen on CC slice 41, ML slice 28, spot MLO slice 28, MLO slice 31. It becomes less conspicuous with spot CC images but is favored to subtly persist on spot CC slice 30.   Diagnostic images of the LEFT breast demonstrate mammographic stability of an oval mass with associated COIL biopsy marking clip. This is consistent with benign biopsy proven fibroadenoma. Spot magnification views of the LEFT breast demonstrate a new group of branching and punctate calcifications in the upper outer breast at posterior depth. These span approximately 5 mm.   On physical exam, no suspicious mass is appreciated.   Targeted ultrasound was performed of the RIGHT breast. At 1 o'clock 6 cm from nipple, there is an irregular hypoechoic mass with indistinct margins. This measures 6 x 7 x 4 mm. This is favored to correspond to the area of architectural distortion.   Targeted ultrasound was performed of the RIGHT axilla. No suspicious axillary lymph nodes are visualized.   Targeted ultrasound was performed of the LEFT breast.  CORRECTED, as per addendum below: At 2 o'clock 3 cm from the nipple there is revisualization of an oval circumscribed hypoechoic mass. It measures 12 x 6 by 12 mm, NOT significantly changed comparison to prior. It demonstrates an associated echogenic focus consistent with COIL biopsy marking clip AND is consistent with a benign biopsy proven fibroadenoma. Adjacent mass at 1 o'clock 3 cm from the nipple is no longer visualized. is consistent with a benign biopsy proven fibroadenoma.    At 3 o'clock 7 cm from the nipple there is an oval circumscribed anechoic mass with posterior acoustic enhancement. This measures 8 by 8 x 5 mm and is consistent with a benign cyst. Today falling at 2-2:30 7 cm from the nipple but labeled as 3 o'clock 7 cm from the nipple for consistency purposes, there is revisualization of  an oval circumscribed hypoechoic mass. It measures 4 x 3 x 4 mm, unchanged in comparison to prior.   IMPRESSION: 1. In the RIGHT breast, there is a subtle area of architectural distortion with a favored 7 mm sonographic correlate at  1 o'clock. Recommend ultrasound-guided biopsy for definitive characterization. Recommend attention on post marker placement mammogram to assess for mammographic/sonographic correlation. 2. There is a 5 mm group of indeterminate calcifications in the LEFT breast. Recommend stereotactic guided biopsy for definitive characterization. 3. Unchanged probably benign LEFT 4 mm breast mass at 3 o'clock 7 cm from the nipple. Option for continued follow-up versus definitive characterization with biopsy was discussed with patient. Patient would prefer to proceed with definitive characterization at this point in time. As such, recommend ultrasound-guided biopsy for definitive characterization. 4. 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 Interval resolution of the second adjacent mass, consistent with a benign etiology. 5. Additional benign LEFT breast cyst is noted at 3 o'clock 7 cm from the nipple. 6. No suspicious RIGHT axillary adenopathy. 7. Given history of atypical lobular hyperplasia, recommend consideration of supplemental screening with breast MRI with and without contrast.   RECOMMENDATION: 1. RIGHT breast ultrasound-guided biopsy x1 2. LEFT breast ultrasound-guided biopsy x1 3. LEFT breast stereotactic guided biopsy x1   BI-RADS CATEGORY  4: Suspicious.   Electronically Signed: By: Corean Salter M.D. On: 10/08/2023 16:27  ADDENDUM REPORT: 10/16/2023 16:14   ADDENDUM: Transcription error: At 2 o'clock 3 cm from the nipple there is revisualization of an oval circumscribed hypoechoic mass. It measures 12 x 6 by 12 mm, NOT significantly changed comparison to prior. It demonstrates an associated echogenic focus consistent  with COIL biopsy marking clip AND is consistent with a benign biopsy proven fibroadenoma. Adjacent mass at 1 o'clock 3 cm from the nipple is no longer visualized.     Electronically Signed   By: Corean Salter M.D.   On: 10/16/2023 16:14   Within last 24 hrs: No results found.  Assessment     Patient Active Problem List   Diagnosis Date Noted   Carcinoma of upper-inner quadrant of right breast in female, estrogen receptor positive (HCC) 10/27/2023   At high risk for breast cancer 09/09/2022   Radial scar of breast 11/07/2013    Plan    I discussed the available options with the patient. The risk of recurrence is similar between mastectomy and lumpectomy with radiation.  I also discussed that given the small size of the cancer would recommend localization lumpectomy with radiation to follow.  I also discussed that we would need to do a sentinel lymph node biopsy to check the nodes.   Explained to the patient that after her surgical treatment additional treatment will depend on her prognostic indicators and stage.   Options reviewed extensively, along with potential of referral for immediate reconstruction should she prefer a more aggressive surgical approach.   I discussed risks of bleeding, infection, damage to surrounding tissues, having positive margins, needing further resection, damage to nerves causing arm numbness or difficulty raising arm, causing lymphedema in the arm; as well as anesthesia risks.   Patient was given the opportunity to ask questions and have them answered.  They would like to proceed with right breast Scout tag localized lumpectomy with sentinel lymph node biopsy.      I personally spent a total of 60 minutes in the care of the patient today including preparing to see the patient, performing a medically appropriate exam/evaluation, counseling and educating, placing orders, documenting clinical information in the EHR, and independently interpreting  results.   These notes generated with voice recognition software. I apologize for typographical errors.  [name] M.D., FACS 10/29/2023, 2:19 PM

## 2023-10-29 NOTE — Patient Instructions (Addendum)
 We have spoken today about removing a lump in your breast. This will be done by Dr. Claudine Mouton at [facility].  If you are on any injectable weight loss medication, you will need to stop taking your GLP-1 injectable (weight loss) medications 8 days before your surgery to avoid any complications with anesthesia.   You will most likely be able to leave the hospital several hours after your surgery. Rarely, a patient needs to stay over night but this is a possibility.  Plan to tentatively be off work for 1-2 weeks following the surgery and may return with approximately 2 more weeks of a lifting restriction, no greater than 15 lbs.  Please see your Blue surgery sheet for more information. Our surgery scheduler will call you to look at surgery dates and to go over information.   If you have FMLA or Disability paperwork that needs to be filled out, please have your company fax your paperwork to [phone] or you may drop this by either office. This paperwork will be filled out within 3 days after your surgery has been completed.    Lumpectomy A lumpectomy is a form of "breast conserving" or "breast preservation" surgery. It may also be referred to as a partial mastectomy. During a lumpectomy, the portion of the breast that contains the cancerous tumor or breast mass (the lump) is removed. Some normal tissue around the lump may also be removed to make sure all of the tumor has been removed.  LET [facility] CARE PROVIDER KNOW ABOUT: Any allergies you have. All medicines you are taking, including vitamins, herbs, eye drops, creams, and over-the-counter medicines. Previous problems you or members of your family have had with the use of anesthetics. Any blood disorders you have. Previous surgeries you have had. Medical conditions you have. RISKS AND COMPLICATIONS Generally, this is a safe procedure. However, problems can occur and include: Bleeding. Infection. Pain. Temporary swelling. Change in the  shape of the breast, particularly if a large portion is removed. BEFORE THE PROCEDURE Ask your health care provider about changing or stopping your regular medicines. This is especially important if you are taking diabetes medicines or blood thinners. Do not eat or drink anything after midnight on the night before the procedure or as directed by your health care provider. Ask your health care provider if you can take a sip of water with any approved medicines. On the day of surgery, your health care provider will use a mammogram or ultrasound to locate and mark the tumor in your breast. These markings on your breast will show where the cut (incision) will be made.   PROCEDURE  An IV tube will be put into one of your veins. You may be given medicine to help you relax before the surgery (sedative). You will be given one of the following: A medicine that numbs the area (local anesthetic). A medicine that makes you fall asleep (general anesthetic). Your health care provider will use a kind of electric scalpel that uses heat to minimize bleeding (electrocautery knife). A curved incision (like a smile or frown) that follows the natural curve of your breast is made, to allow for minimal scarring and better healing. The tumor will be removed with some of the surrounding tissue. This will be sent to the lab for analysis. Your health care provider may also remove your lymph nodes at this time if needed. Sometimes, but not always, a rubber tube called a drain will be surgically inserted into your breast area or  armpit to collect excess fluid that may accumulate in the space where the tumor was. This drain is connected to a plastic bulb on the outside of your body. This drain creates suction to help remove the fluid. The incisions will be closed with stitches (sutures). A bandage may be placed over the incisions. AFTER THE PROCEDURE You will be taken to the recovery area. You will be given medicine for  pain. A small rubber drain may be placed in the breast for 2-3 days to prevent a collection of blood (hematoma) from developing in the breast. You will be given instructions on caring for the drain before you go home. A pressure bandage (dressing) will be applied for 1-2 days to prevent bleeding. Ask your health care provider how to care for your bandage at home.   This information is not intended to replace advice given to you by your health care provider. Make sure you discuss any questions you have with your health care provider.   Document Released: 06/02/2006 Document Revised: 05/12/2014 Document Reviewed: 09/24/2012 Elsevier Interactive Patient Education [employer]

## 2023-10-30 ENCOUNTER — Other Ambulatory Visit: Payer: Self-pay | Admitting: Surgery

## 2023-10-30 ENCOUNTER — Ambulatory Visit
Admission: RE | Admit: 2023-10-30 | Discharge: 2023-10-30 | Disposition: A | Discharge status: Home / Self Care | Source: Ambulatory Visit | Attending: Surgery | Admitting: Surgery

## 2023-10-30 ENCOUNTER — Encounter: Payer: Self-pay | Admitting: *Deleted

## 2023-10-30 DIAGNOSIS — Z170 Estrogen receptor positive status [ER+]: Secondary | ICD-10-CM | POA: Insufficient documentation

## 2023-10-30 DIAGNOSIS — C50919 Malignant neoplasm of unspecified site of unspecified female breast: Secondary | ICD-10-CM

## 2023-10-30 DIAGNOSIS — C50911 Malignant neoplasm of unspecified site of right female breast: Secondary | ICD-10-CM | POA: Diagnosis present

## 2023-10-30 MED ORDER — GADOBUTROL 1 MMOL/ML IV SOLN
7.5000 mL | Freq: Once | INTRAVENOUS | Status: AC | PRN
  Administered 2023-10-30: 7.5 mL via INTRAVENOUS

## 2023-10-30 NOTE — Progress Notes (Signed)
 Lumpectomy is tentatively scheduled for 7/16.  She will see Dr. KATHEE and Dr. Lenn on 8/5.

## 2023-11-02 ENCOUNTER — Telehealth: Payer: Self-pay | Admitting: Surgery

## 2023-11-02 ENCOUNTER — Ambulatory Visit
Admission: RE | Admit: 2023-11-02 | Discharge: 2023-11-02 | Disposition: A | Discharge status: Home / Self Care | Source: Ambulatory Visit | Attending: Surgery | Admitting: Surgery

## 2023-11-02 DIAGNOSIS — C50919 Malignant neoplasm of unspecified site of unspecified female breast: Secondary | ICD-10-CM | POA: Diagnosis present

## 2023-11-02 HISTORY — PX: BREAST BIOPSY: SHX20

## 2023-11-02 MED ORDER — LIDOCAINE HCL 1 % IJ SOLN
10.0000 mL | Freq: Once | INTRAMUSCULAR | Status: AC
  Administered 2023-11-02: 10 mL
  Filled 2023-11-02: qty 10

## 2023-11-02 NOTE — Telephone Encounter (Signed)
 Patient has been advised of Pre-Admission date/time, and Surgery date at [facility].  Surgery Date: 11/18/23 Preadmission Testing Date: 11/10/23 (phone 8a-1p)  Patient informed of the scheduling process and surgery information given at time of office visit.    Patient reminded of breast tag to be placed at [facility] 11/02/23.    Patient given arrival time of 7:30 am on 716/25 as will have sentinel lymph node bx injection done prior to surgery.

## 2023-11-03 ENCOUNTER — Inpatient Hospital Stay: Admitting: Licensed Clinical Social Worker

## 2023-11-10 ENCOUNTER — Encounter
Admission: RE | Admit: 2023-11-10 | Discharge: 2023-11-10 | Disposition: A | Discharge status: Home / Self Care | Source: Ambulatory Visit | Attending: Surgery | Admitting: Surgery

## 2023-11-10 ENCOUNTER — Other Ambulatory Visit: Payer: Self-pay

## 2023-11-10 DIAGNOSIS — I10 Essential (primary) hypertension: Secondary | ICD-10-CM

## 2023-11-10 DIAGNOSIS — Z01812 Encounter for preprocedural laboratory examination: Secondary | ICD-10-CM

## 2023-11-10 HISTORY — DX: Malignant (primary) neoplasm, unspecified: C80.1

## 2023-11-10 NOTE — Patient Instructions (Addendum)
 Your procedure is scheduled on: 11/18/23 - Wednesday Report to the Registration Desk on the 1st floor of the [facility]. To find out your arrival time, please call [phone] between 1PM - 3PM on: 11/17/23 - Tuesday If your arrival time is 6:00 am, do not arrive before that time as the [facility] entrance doors do not open until 6:00 am.  REMEMBER: Instructions that are not followed completely may result in serious medical risk, up to and including death; or upon the discretion of your surgeon and anesthesiologist your surgery may need to be rescheduled.  Do not eat food after midnight the night before surgery.  No gum chewing or hard candies.  You may however, drink CLEAR liquids up to 2 hours before you are scheduled to arrive for your surgery. Do not drink anything within 2 hours of your scheduled arrival time.  Clear liquids include: - water  - apple juice without pulp - gatorade (not RED colors) - black coffee or tea (Do NOT add milk or creamers to the coffee or tea) Do NOT drink anything that is not on this list.   One week prior to surgery: Stop Anti-inflammatories (NSAIDS) such as Advil, Aleve, Ibuprofen, Motrin, Naproxen, Naprosyn and Aspirin based products such as Excedrin, Goody's Powder, BC Powder. You may continue to take Tylenol if needed for pain up until the day of surgery.  Stop ANY OVER THE COUNTER supplements until after surgery : VITAMIN B12   losartan-hydrochlorothiazide - hold the morning of surgery.  ON THE DAY OF SURGERY ONLY TAKE THESE MEDICATIONS WITH SIPS OF WATER:  levothyroxine (SYNTHROID)   No Alcohol for 24 hours before or after surgery.  No Smoking including e-cigarettes for 24 hours before surgery.  No chewable tobacco products for at least 6 hours before surgery.  No nicotine patches on the day of surgery.  Do not use any recreational drugs for at least a week (preferably 2 weeks) before your surgery.  Please be advised that the  combination of cocaine and anesthesia may have negative outcomes, up to and including death. If you test positive for cocaine, your surgery will be cancelled.  On the morning of surgery brush your teeth with toothpaste and water, you may rinse your mouth with mouthwash if you wish. Do not swallow any toothpaste or mouthwash.  Use CHG Soap or wipes as directed on instruction sheet.  Do not wear jewelry, make-up, hairpins, clips or nail polish.  For welded (permanent) jewelry: bracelets, anklets, waist bands, etc.  Please have this removed prior to surgery.  If it is not removed, there is a chance that hospital personnel will need to cut it off on the day of surgery.  Do not wear lotions, powders, or perfumes.   Do not shave body hair from the neck down 48 hours before surgery.  Contact lenses, hearing aids and dentures may not be worn into surgery.  Do not bring valuables to the hospital. [facility] is not responsible for any missing/lost belongings or valuables.   Notify your doctor if there is any change in your medical condition (cold, fever, infection).  Wear comfortable clothing (specific to your surgery type) to the hospital.  After surgery, you can help prevent lung complications by doing breathing exercises.  Take deep breaths and cough every 1-2 hours. Your doctor may order a device called an Incentive Spirometer to help you take deep breaths.  When coughing or sneezing, hold a pillow firmly against your incision with both hands. This  is called "splinting." Doing this helps protect your incision. It also decreases belly discomfort.  If you are being admitted to the hospital overnight, leave your suitcase in the car. After surgery it may be brought to your room.  In case of increased patient census, it may be necessary for you, the patient, to continue your postoperative care in the Same Day Surgery department.  If you are being discharged the day of surgery, you will not be  allowed to drive home. You will need a responsible individual to drive you home and stay with you for 24 hours after surgery.   If you are taking public transportation, you will need to have a responsible individual with you.  Please call the Pre-admissions Testing Dept. at [phone] if you have any questions about these instructions.  Surgery Visitation Policy:  Patients having surgery or a procedure may have two visitors.  Children under the age of 5 must have an adult with them who is not the patient.  Inpatient Visitation:    Visiting hours are 7 a.m. to 8 p.m. Up to four visitors are allowed at one time in a patient room. The visitors may rotate out with other people during the day.  One visitor age 11 or older may stay with the patient overnight and must be in the room by 8 p.m.   Merchandiser, retail to address health-related social needs:  [email or web address]     Preparing for Surgery with CHLORHEXIDINE GLUCONATE (CHG) Soap  Chlorhexidine Gluconate (CHG) Soap  o An antiseptic cleaner that kills germs and bonds with the skin to continue killing germs even after washing  o Used for showering the night before surgery and morning of surgery  Before surgery, you can play an important role by reducing the number of germs on your skin.  CHG (Chlorhexidine gluconate) soap is an antiseptic cleanser which kills germs and bonds with the skin to continue killing germs even after washing.  Please do not use if you have an allergy to CHG or antibacterial soaps. If your skin becomes reddened/irritated stop using the CHG.  1. Shower the NIGHT BEFORE SURGERY and the MORNING OF SURGERY with CHG soap.  2. If you choose to wash your hair, wash your hair first as usual with your normal shampoo.  3. After shampooing, rinse your hair and body thoroughly to remove the shampoo.  4. Use CHG as you would any other liquid soap. You can apply CHG directly to the skin  and wash gently with a scrungie or a clean washcloth.  5. Apply the CHG soap to your body only from the neck down. Do not use on open wounds or open sores. Avoid contact with your eyes, ears, mouth, and genitals (private parts). Wash face and genitals (private parts) with your normal soap.  6. Wash thoroughly, paying special attention to the area where your surgery will be performed.  7. Thoroughly rinse your body with warm water.  8. Do not shower/wash with your normal soap after using and rinsing off the CHG soap.  9. Pat yourself dry with a clean towel.  10. Wear clean pajamas to bed the night before surgery.  12. Place clean sheets on your bed the night of your first shower and do not sleep with pets.  13. Shower again with the CHG soap on the day of surgery prior to arriving at the hospital.  14. Do not apply any deodorants/lotions/powders.  15. Please wear clean clothes to the hospital.

## 2023-11-11 ENCOUNTER — Ambulatory Visit: Payer: Self-pay | Admitting: Surgery

## 2023-11-11 ENCOUNTER — Encounter
Admission: RE | Admit: 2023-11-11 | Discharge: 2023-11-11 | Disposition: A | Discharge status: Home / Self Care | Source: Ambulatory Visit | Attending: Surgery | Admitting: Surgery

## 2023-11-11 DIAGNOSIS — I10 Essential (primary) hypertension: Secondary | ICD-10-CM | POA: Insufficient documentation

## 2023-11-11 DIAGNOSIS — Z170 Estrogen receptor positive status [ER+]: Secondary | ICD-10-CM | POA: Insufficient documentation

## 2023-11-11 DIAGNOSIS — Z01818 Encounter for other preprocedural examination: Secondary | ICD-10-CM | POA: Insufficient documentation

## 2023-11-11 DIAGNOSIS — C50911 Malignant neoplasm of unspecified site of right female breast: Secondary | ICD-10-CM | POA: Insufficient documentation

## 2023-11-11 LAB — COMPREHENSIVE METABOLIC PANEL WITH GFR
ALT: 22 U/L (ref 0–44)
AST: 19 U/L (ref 15–41)
Albumin: 3.8 g/dL (ref 3.5–5.0)
Alkaline Phosphatase: 62 U/L (ref 38–126)
Anion gap: 8 (ref 5–15)
BUN: 16 mg/dL (ref 6–20)
CO2: 26 mmol/L (ref 22–32)
Calcium: 9.4 mg/dL (ref 8.9–10.3)
Chloride: 104 mmol/L (ref 98–111)
Creatinine, Ser: 0.71 mg/dL (ref 0.44–1.00)
GFR, Estimated: >60 mL/min (ref >60)
Glucose, Bld: 92 mg/dL (ref 70–99)
Potassium: 3.4 mmol/L — ABNORMAL LOW (ref 3.5–5.1)
Sodium: 138 mmol/L (ref 135–145)
Total Bilirubin: 0.8 mg/dL (ref 0.0–1.2)
Total Protein: 7.5 g/dL (ref 6.5–8.1)

## 2023-11-11 LAB — CBC WITH DIFFERENTIAL/PLATELET
Abs Immature Granulocytes: 0.04 K/uL (ref 0.00–0.07)
Basophils Absolute: 0.1 K/uL (ref 0.0–0.1)
Basophils Relative: 1 %
Eosinophils Absolute: 0.1 K/uL (ref 0.0–0.5)
Eosinophils Relative: 1 %
HCT: 40 % (ref 36.0–46.0)
Hemoglobin: 13.2 g/dL (ref 12.0–15.0)
Immature Granulocytes: 0 %
Lymphocytes Relative: 21 %
Lymphs Abs: 1.9 K/uL (ref 0.7–4.0)
MCH: 28.6 pg (ref 26.0–34.0)
MCHC: 33 g/dL (ref 30.0–36.0)
MCV: 86.6 fL (ref 80.0–100.0)
Monocytes Absolute: 0.5 K/uL (ref 0.1–1.0)
Monocytes Relative: 6 %
Neutro Abs: 6.3 K/uL (ref 1.7–7.7)
Neutrophils Relative %: 71 %
Platelets: 264 K/uL (ref 150–400)
RBC: 4.62 MIL/uL (ref 3.87–5.11)
RDW: 13.3 % (ref 11.5–15.5)
WBC: 8.9 K/uL (ref 4.0–10.5)
nRBC: 0 % (ref 0.0–0.2)

## 2023-11-18 ENCOUNTER — Encounter
Admission: RE | Disposition: A | Discharge status: Home / Self Care | Payer: Self-pay | Source: Home / Self Care | Attending: Surgery

## 2023-11-18 ENCOUNTER — Ambulatory Visit
Admission: RE | Admit: 2023-11-18 | Discharge: 2023-11-18 | Disposition: A | Discharge status: Home / Self Care | Attending: Surgery | Admitting: Surgery

## 2023-11-18 ENCOUNTER — Other Ambulatory Visit: Payer: Self-pay

## 2023-11-18 ENCOUNTER — Ambulatory Visit: Payer: Self-pay | Admitting: Urgent Care

## 2023-11-18 ENCOUNTER — Encounter: Payer: Self-pay | Admitting: Surgery

## 2023-11-18 ENCOUNTER — Ambulatory Visit
Admission: RE | Admit: 2023-11-18 | Discharge: 2023-11-18 | Disposition: A | Discharge status: Home / Self Care | Source: Ambulatory Visit | Attending: Surgery | Admitting: Surgery

## 2023-11-18 DIAGNOSIS — Z1721 Progesterone receptor positive status: Secondary | ICD-10-CM | POA: Insufficient documentation

## 2023-11-18 DIAGNOSIS — E039 Hypothyroidism, unspecified: Secondary | ICD-10-CM | POA: Insufficient documentation

## 2023-11-18 DIAGNOSIS — Z170 Estrogen receptor positive status [ER+]: Secondary | ICD-10-CM | POA: Insufficient documentation

## 2023-11-18 DIAGNOSIS — I10 Essential (primary) hypertension: Secondary | ICD-10-CM | POA: Diagnosis not present

## 2023-11-18 DIAGNOSIS — Z79890 Hormone replacement therapy (postmenopausal): Secondary | ICD-10-CM | POA: Diagnosis not present

## 2023-11-18 DIAGNOSIS — C50911 Malignant neoplasm of unspecified site of right female breast: Secondary | ICD-10-CM | POA: Insufficient documentation

## 2023-11-18 DIAGNOSIS — Z01812 Encounter for preprocedural laboratory examination: Secondary | ICD-10-CM

## 2023-11-18 DIAGNOSIS — Z1732 Human epidermal growth factor receptor 2 negative status: Secondary | ICD-10-CM | POA: Diagnosis not present

## 2023-11-18 DIAGNOSIS — C50919 Malignant neoplasm of unspecified site of unspecified female breast: Secondary | ICD-10-CM

## 2023-11-18 HISTORY — PX: AXILLARY SENTINEL NODE BIOPSY: SHX5738

## 2023-11-18 HISTORY — PX: BREAST LUMPECTOMY WITH RADIO FREQUENCY LOCALIZER: SHX6897

## 2023-11-18 LAB — POCT PREGNANCY, URINE: Preg Test, Ur: NEGATIVE

## 2023-11-18 SURGERY — BREAST LUMPECTOMY WITH RADIO FREQUENCY LOCALIZER
Anesthesia: General | Site: Breast | Laterality: Right

## 2023-11-18 MED ORDER — GABAPENTIN 300 MG PO CAPS
300.0000 mg | ORAL_CAPSULE | ORAL | Status: AC
  Administered 2023-11-18: 300 mg via ORAL

## 2023-11-18 MED ORDER — FENTANYL CITRATE (PF) 100 MCG/2ML IJ SOLN
INTRAMUSCULAR | Status: AC
Start: 2023-11-18 — End: 2023-11-18
  Filled 2023-11-18: qty 2

## 2023-11-18 MED ORDER — PROPOFOL 10 MG/ML IV BOLUS
INTRAVENOUS | Status: DC | PRN
  Administered 2023-11-18: 200 mg via INTRAVENOUS

## 2023-11-18 MED ORDER — MIDAZOLAM HCL 2 MG/2ML IJ SOLN
INTRAMUSCULAR | Status: AC
  Filled 2023-11-18: qty 2

## 2023-11-18 MED ORDER — ACETAMINOPHEN 500 MG PO TABS
1000.0000 mg | ORAL_TABLET | ORAL | Status: AC
  Administered 2023-11-18: 1000 mg via ORAL

## 2023-11-18 MED ORDER — BUPIVACAINE LIPOSOME 1.3 % IJ SUSP: 20.0000 mL | Freq: Once | INTRAMUSCULAR | Status: DC

## 2023-11-18 MED ORDER — LACTATED RINGERS IV SOLN: INTRAVENOUS | Status: DC

## 2023-11-18 MED ORDER — FENTANYL CITRATE (PF) 100 MCG/2ML IJ SOLN
INTRAMUSCULAR | Status: AC
  Filled 2023-11-18: qty 2

## 2023-11-18 MED ORDER — CELECOXIB 200 MG PO CAPS
200.0000 mg | ORAL_CAPSULE | ORAL | Status: AC
  Administered 2023-11-18: 200 mg via ORAL

## 2023-11-18 MED ORDER — BUPIVACAINE-EPINEPHRINE (PF) 0.25% -1:200000 IJ SOLN
INTRAMUSCULAR | Status: DC | PRN
  Administered 2023-11-18: 20 mL

## 2023-11-18 MED ORDER — DEXAMETHASONE SODIUM PHOSPHATE 10 MG/ML IJ SOLN
INTRAMUSCULAR | Status: DC | PRN
  Administered 2023-11-18: 10 mg via INTRAVENOUS

## 2023-11-18 MED ORDER — CHLORHEXIDINE GLUCONATE CLOTH 2 % EX PADS
6.0000 | MEDICATED_PAD | Freq: Once | CUTANEOUS | Status: AC
  Administered 2023-11-18: 6 via TOPICAL

## 2023-11-18 MED ORDER — ONDANSETRON HCL 4 MG/2ML IJ SOLN: 4.0000 mg | Freq: Once | INTRAMUSCULAR | Status: DC | PRN

## 2023-11-18 MED ORDER — ISOSULFAN BLUE 1 % ~~LOC~~ SOLN
SUBCUTANEOUS | Status: DC | PRN
  Administered 2023-11-18: 4 mL via SUBCUTANEOUS

## 2023-11-18 MED ORDER — CHLORHEXIDINE GLUCONATE 0.12 % MT SOLN
15.0000 mL | Freq: Once | OROMUCOSAL | Status: AC
  Administered 2023-11-18: 15 mL via OROMUCOSAL

## 2023-11-18 MED ORDER — MIDAZOLAM HCL 2 MG/2ML IJ SOLN
INTRAMUSCULAR | Status: DC | PRN
  Administered 2023-11-18: 1 mg via INTRAVENOUS
  Administered 2023-11-18: 3 mg via INTRAVENOUS

## 2023-11-18 MED ORDER — OXYCODONE HCL 5 MG PO TABS: 5.0000 mg | ORAL_TABLET | Freq: Four times a day (QID) | ORAL | 0 refills | Status: DC | PRN

## 2023-11-18 MED ORDER — OXYCODONE HCL 5 MG/5ML PO SOLN: 5.0000 mg | Freq: Once | ORAL | Status: AC | PRN

## 2023-11-18 MED ORDER — ISOSULFAN BLUE 1 % ~~LOC~~ SOLN
SUBCUTANEOUS | Status: AC
Start: 2023-11-18 — End: 2023-11-18
  Filled 2023-11-18: qty 5

## 2023-11-18 MED ORDER — CEFAZOLIN SODIUM-DEXTROSE 2-4 GM/100ML-% IV SOLN
2.0000 g | INTRAVENOUS | Status: AC
  Administered 2023-11-18: 2 g via INTRAVENOUS

## 2023-11-18 MED ORDER — FENTANYL CITRATE (PF) 100 MCG/2ML IJ SOLN: 25.0000 ug | INTRAMUSCULAR | Status: DC | PRN

## 2023-11-18 MED ORDER — IBUPROFEN 800 MG PO TABS: 800.0000 mg | ORAL_TABLET | Freq: Three times a day (TID) | ORAL | 0 refills | Status: DC | PRN

## 2023-11-18 MED ORDER — OXYCODONE HCL 5 MG PO TABS
ORAL_TABLET | ORAL | Status: AC
  Filled 2023-11-18: qty 1

## 2023-11-18 MED ORDER — CELECOXIB 200 MG PO CAPS
ORAL_CAPSULE | ORAL | Status: AC
  Filled 2023-11-18: qty 1

## 2023-11-18 MED ORDER — CHLORHEXIDINE GLUCONATE 0.12 % MT SOLN
OROMUCOSAL | Status: AC
  Filled 2023-11-18: qty 15

## 2023-11-18 MED ORDER — GABAPENTIN 300 MG PO CAPS
ORAL_CAPSULE | ORAL | Status: AC
  Filled 2023-11-18: qty 1

## 2023-11-18 MED ORDER — TECHNETIUM TC 99M TILMANOCEPT KIT
1.0300 | PACK | Freq: Once | INTRAVENOUS | Status: AC | PRN
  Administered 2023-11-18: 1.03 via INTRADERMAL

## 2023-11-18 MED ORDER — LIDOCAINE HCL (PF) 2 % IJ SOLN
INTRAMUSCULAR | Status: AC
  Filled 2023-11-18: qty 5

## 2023-11-18 MED ORDER — EPHEDRINE SULFATE-NACL 50-0.9 MG/10ML-% IV SOSY
PREFILLED_SYRINGE | INTRAVENOUS | Status: DC | PRN
Start: 2023-11-18 — End: 2023-11-18
  Administered 2023-11-18 (×3): 5 mg via INTRAVENOUS

## 2023-11-18 MED ORDER — BUPIVACAINE-EPINEPHRINE (PF) 0.25% -1:200000 IJ SOLN
INTRAMUSCULAR | Status: AC
  Filled 2023-11-18: qty 30

## 2023-11-18 MED ORDER — ORAL CARE MOUTH RINSE: 15.0000 mL | Freq: Once | OROMUCOSAL | Status: AC

## 2023-11-18 MED ORDER — ONDANSETRON HCL 4 MG/2ML IJ SOLN
INTRAMUSCULAR | Status: DC | PRN
  Administered 2023-11-18: 4 mg via INTRAVENOUS

## 2023-11-18 MED ORDER — CEFAZOLIN SODIUM-DEXTROSE 2-4 GM/100ML-% IV SOLN
INTRAVENOUS | Status: AC
  Filled 2023-11-18: qty 100

## 2023-11-18 MED ORDER — ACETAMINOPHEN 10 MG/ML IV SOLN: 1000.0000 mg | Freq: Once | INTRAVENOUS | Status: DC | PRN

## 2023-11-18 MED ORDER — FENTANYL CITRATE (PF) 100 MCG/2ML IJ SOLN
INTRAMUSCULAR | Status: DC | PRN
  Administered 2023-11-18: 25 ug via INTRAVENOUS
  Administered 2023-11-18: 100 ug via INTRAVENOUS
  Administered 2023-11-18: 25 ug via INTRAVENOUS
  Administered 2023-11-18: 50 ug via INTRAVENOUS

## 2023-11-18 MED ORDER — OXYCODONE HCL 5 MG PO TABS
5.0000 mg | ORAL_TABLET | Freq: Once | ORAL | Status: AC | PRN
  Administered 2023-11-18: 5 mg via ORAL

## 2023-11-18 MED ORDER — DEXAMETHASONE SODIUM PHOSPHATE 10 MG/ML IJ SOLN
INTRAMUSCULAR | Status: AC
  Filled 2023-11-18: qty 1

## 2023-11-18 MED ORDER — ACETAMINOPHEN 500 MG PO TABS
ORAL_TABLET | ORAL | Status: AC
  Filled 2023-11-18: qty 2

## 2023-11-18 MED ORDER — LIDOCAINE HCL (CARDIAC) PF 100 MG/5ML IV SOSY
PREFILLED_SYRINGE | INTRAVENOUS | Status: DC | PRN
  Administered 2023-11-18: 100 mg via INTRAVENOUS

## 2023-11-18 MED ORDER — BUPIVACAINE LIPOSOME 1.3 % IJ SUSP
INTRAMUSCULAR | Status: AC
Start: 2023-11-18 — End: 2023-11-18
  Filled 2023-11-18: qty 20

## 2023-11-18 MED ORDER — ONDANSETRON HCL 4 MG/2ML IJ SOLN
INTRAMUSCULAR | Status: AC
  Filled 2023-11-18: qty 2

## 2023-11-18 SURGICAL SUPPLY — 32 items
BLADE SURG 15 STRL LF DISP TIS (BLADE) ×2 IMPLANT
CHLORAPREP W/TINT 26 (MISCELLANEOUS) IMPLANT
CLIP APPLIE 9.375 SM OPEN (CLIP) IMPLANT
CNTNR URN SCR LID CUP LEK RST (MISCELLANEOUS) IMPLANT
COVER PROBE GAMMA FINDER SLV (MISCELLANEOUS) ×2 IMPLANT
DERMABOND ADVANCED .7 DNX12 (GAUZE/BANDAGES/DRESSINGS) ×2 IMPLANT
DEVICE DUBIN SPECIMEN MAMMOGRA (MISCELLANEOUS) ×2 IMPLANT
DRAPE LAPAROTOMY TRNSV 106X77 (MISCELLANEOUS) ×2 IMPLANT
ELECTRODE BLDE 4.0 EZ CLN MEGD (MISCELLANEOUS) IMPLANT
ELECTRODE CAUTERY BLDE TIP 2.5 (TIP) ×2 IMPLANT
ELECTRODE REM PT RTRN 9FT ADLT (ELECTROSURGICAL) ×2 IMPLANT
GAUZE 4X4 16PLY ~~LOC~~+RFID DBL (SPONGE) IMPLANT
GLOVE ORTHO TXT STRL SZ7.5 (GLOVE) ×2 IMPLANT
GOWN STRL REUS W/ TWL LRG LVL3 (GOWN DISPOSABLE) ×2 IMPLANT
GOWN STRL REUS W/ TWL XL LVL3 (GOWN DISPOSABLE) ×2 IMPLANT
KIT MARKER MARGIN INK (KITS) IMPLANT
KIT TURNOVER KIT A (KITS) ×2 IMPLANT
MANIFOLD NEPTUNE II (INSTRUMENTS) ×2 IMPLANT
NDL HYPO 22X1.5 SAFETY MO (MISCELLANEOUS) ×2 IMPLANT
NDL SAFETY ECLIPSE 18X1.5 (NEEDLE) IMPLANT
NEEDLE HYPO 22X1.5 SAFETY MO (MISCELLANEOUS) ×2 IMPLANT
PACK BASIN MINOR ARMC (MISCELLANEOUS) ×2 IMPLANT
SHEATH BREAST BIOPSY SKIN MKR (SHEATH) ×2 IMPLANT
SUT SILK 2 0 SH (SUTURE) IMPLANT
SUT VIC AB 3-0 SH 27X BRD (SUTURE) ×2 IMPLANT
SUTURE MNCRL 4-0 27XMF (SUTURE) ×2 IMPLANT
SYR 10ML LL (SYRINGE) IMPLANT
SYR 20ML LL LF (SYRINGE) ×2 IMPLANT
TRAP FLUID SMOKE EVACUATOR (MISCELLANEOUS) ×2 IMPLANT
TRAP NEPTUNE SPECIMEN COLLECT (MISCELLANEOUS) ×2 IMPLANT
WATER STERILE IRR 1000ML POUR (IV SOLUTION) ×2 IMPLANT
WATER STERILE IRR 500ML POUR (IV SOLUTION) ×2 IMPLANT

## 2023-11-18 NOTE — Anesthesia Procedure Notes (Signed)
 Procedure Name: LMA Insertion Date/Time: 11/18/2023 10:47 AM  Performed by: Viviana Lacks, RNPre-anesthesia Checklist: Patient identified, Patient being monitored, Timeout performed, Emergency Drugs available and Suction available Patient Re-evaluated:Patient Re-evaluated prior to induction Oxygen Delivery Method: Circle system utilized Preoxygenation: Pre-oxygenation with 100% oxygen Induction Type: IV induction Ventilation: Mask ventilation without difficulty LMA: LMA inserted LMA Size: 4.0 Tube type: Oral Number of attempts: 1 Placement Confirmation: positive ETCO2 and breath sounds checked- equal and bilateral Tube secured with: Tape Dental Injury: Teeth and Oropharynx as per pre-operative assessment

## 2023-11-18 NOTE — Op Note (Signed)
 Pre-operative Diagnosis: Breast Cancer, Right     Post-operative Diagnosis: Same  Surgeon: [name], M.D., [facility]  Anesthesia: General  Procedure: Right Lumpectomy, Ninette Scout tag directed, sentinel lymph node biopsy  Procedure Details  The patient was seen again in the Holding Room. The benefits, complications, treatment options, and expected outcomes were discussed with the patient. The risks of bleeding, infection, recurrence of symptoms, failure to resolve symptoms, hematoma, seroma, open wound, cosmetic deformity, and the need for further surgery were discussed.  The patient was taken to Operating Room, identified as Yolanda Gill and the procedure verified.  A Time Out was held and the above information confirmed.  Prior to the induction of general anesthesia, antibiotic prophylaxis was administered. VTE prophylaxis was in place. The patient was positioned in the supine position. Appropriate anesthesia was then administered and tolerated well.  The [facility] probe confirmed the site of shortest distance and highest cadence and that site is marked for incision.  The breast is secured with tape as needed to minimize motion during the procedure.  A visual dye, Blue 4 ml was injected periareolar dermis early under aseptic conditions.  Massage was administered to this area for 5 minutes prior to securely taping it.  The chest was prepped with Chloraprep and draped in the sterile fashion.  Then using the hand-held probe an area of high counts was identified in the axilla, an incision was made and direction by the probe aided in dissection of a total of 3 lymph nodes which were sent for permanent section.  The first node encountered was blue, 2 larger lymph nodes were also obtained, 1 was stained blue and the other was not.  The 2 larger lymph nodes were both with the highest counts.  These were sent as permanent section and labeled sentinel lymph nodes 1 through 3.   Attention was turned to  the [facility] localization site where an incision was made in the upper inner quadrant of the right breast.. Dissection using the Scout to perform a lumpectomy with adequate margins was performed. This was done with sharp dissection with scissors. Hemostasis is controlled where the risk of damaging the tag is diminished, and the cavity packed.  The specimen was taken to the back table and painted to demarcate the 6 surfaces of potential margin.   I returned to the cavity to remove the packing, and additional hemostasis was confirmed with electrocautery.   Once assuring that hemostasis was adequate and checked multiple times being irrigated with water the wound was closed with interrupted 3-0 Vicryl followed by 4-0 subcuticular Monocryl sutures.    Regional marking clips were placed in both the axilla and the upper inner quadrant of the right breast.   The axillary wound was closed in a similar fashion, prior to the breast procedure. Dermabond is utilized to seal the incision.  Local anesthesia of 0.25% Marcaine  with epinephrine  is infiltrated into the cavity.   Findings: Faxitron imaging: Identifying both marking clips within the specimen.  Margins appear grossly negative.  Estimated Blood Loss: Minimal         Drains: None         Specimens: Upper inner quadrant right breast tissue, 3 right axillary sentinel lymph nodes.       Complications: None         Condition: Stable  Sentinel Node Biopsy Synoptic Operative Report  Operation performed with curative intent:Yes  Tracer(s) used to identify sentinel nodes in the upfront surgery (non-neoadjuvant) setting (  select all that apply):Dye and Radioactive Tracer  Tracer(s) used to identify sentinel nodes in the neoadjuvant setting (select all that apply):N/A  All nodes (colored or non-colored) present at the end of a dye-filled lymphatic channel were removed:Yes   All significantly radioactive nodes were removed:Yes  All palpable  suspicious nodes were removed:N/A  Biopsy-proven positive nodes marked with clips prior to chemotherapy were identified and removed:N/A    [name], M.D., [facility]  11/18/2023 ; 12:42 PM

## 2023-11-18 NOTE — Interval H&P Note (Signed)
 History and Physical Interval Note:  11/18/2023 10:16 AM  Yolanda Gill  has presented today for surgery, with the diagnosis of Malignant neoplasm of right breast in female, estrogen receptor positive.  The various methods of treatment have been discussed with the patient and family. After consideration of risks, benefits and other options for treatment, the patient has consented to  Procedure(s): BREAST LUMPECTOMY WITH RADIO FREQUENCY LOCALIZER (Right) BIOPSY, LYMPH NODE, SENTINEL, AXILLARY (Right) as a surgical intervention.  The patient's history has been reviewed, patient examined, no change in status, stable for surgery.  I have reviewed the patient's chart and labs.  Questions were answered to the patient's satisfaction.     [name]

## 2023-11-18 NOTE — Anesthesia Postprocedure Evaluation (Signed)
 Anesthesia Post Note  Patient: Yolanda Gill  Procedure(s) Performed: BREAST LUMPECTOMY WITH RADIO FREQUENCY LOCALIZER (Right: Breast) BIOPSY, LYMPH NODE, SENTINEL, AXILLARY (Right)  Patient location during evaluation: PACU Anesthesia Type: General Level of consciousness: awake and alert, oriented and patient cooperative Pain management: pain level controlled Vital Signs Assessment: post-procedure vital signs reviewed and stable Respiratory status: spontaneous breathing, nonlabored ventilation and respiratory function stable Cardiovascular status: blood pressure returned to baseline and stable Postop Assessment: adequate PO intake Anesthetic complications: no   No notable events documented.   Last Vitals:  Vitals:   11/18/23 1345 11/18/23 1400  BP: 104/66 113/64  Pulse: 63 65  Resp: (!) 0 11  Temp:  (!) 36.3 C  SpO2: 96% 98%    Last Pain:  Vitals:   11/18/23 1400  TempSrc:   PainSc: Asleep                 Alfonso Ruths

## 2023-11-18 NOTE — Transfer of Care (Signed)
 Immediate Anesthesia Transfer of Care Note  Patient: Yolanda Gill  Procedure(s) Performed: BREAST LUMPECTOMY WITH RADIO FREQUENCY LOCALIZER (Right: Breast) BIOPSY, LYMPH NODE, SENTINEL, AXILLARY (Right)  Patient Location: PACU  Anesthesia Type:General  Level of Consciousness: drowsy and patient cooperative  Airway & Oxygen Therapy: Patient Spontanous Breathing and Patient connected to face mask oxygen  Post-op Assessment: Report given to RN and Post -op Vital signs reviewed and stable  Post vital signs: Reviewed and stable  Last Vitals:  Vitals Value Taken Time  BP 95/56 11/18/23 12:41  Temp    Pulse 56 11/18/23 12:44  Resp 10 11/18/23 12:44  SpO2 98 % 11/18/23 12:44  Vitals shown include unfiled device data.  Last Pain:  Vitals:   11/18/23 0825  TempSrc: Temporal  PainSc: 0-No pain         Complications: No notable events documented.

## 2023-11-18 NOTE — Anesthesia Preprocedure Evaluation (Addendum)
 Anesthesia Evaluation  Patient identified by MRN, date of birth, ID band Patient awake    Reviewed: Allergy & Precautions, NPO status , Patient's Chart, lab work & pertinent test results  History of Anesthesia Complications Negative for: history of anesthetic complications  Airway Mallampati: I   Neck ROM: Full    Dental no notable dental hx.    Pulmonary neg pulmonary ROS   Pulmonary exam normal breath sounds clear to auscultation       Cardiovascular hypertension, Normal cardiovascular exam Rhythm:Regular Rate:Normal  ECG 11/11/23:  Normal sinus rhythm Low voltage QRS Septal infarct , age undetermined   Neuro/Psych negative neurological ROS     GI/Hepatic negative GI ROS,,,  Endo/Other  Hypothyroidism  Prediabetes   Renal/GU negative Renal ROS     Musculoskeletal   Abdominal   Peds  Hematology negative hematology ROS (+)   Anesthesia Other Findings   Reproductive/Obstetrics                              Anesthesia Physical Anesthesia Plan  ASA: 2  Anesthesia Plan: General   Post-op Pain Management:    Induction: Intravenous  PONV Risk Score and Plan: 3 and Ondansetron , Dexamethasone  and Treatment may vary due to age or medical condition  Airway Management Planned: LMA  Additional Equipment:   Intra-op Plan:   Post-operative Plan: Extubation in OR  Informed Consent: I have reviewed the patients History and Physical, chart, labs and discussed the procedure including the risks, benefits and alternatives for the proposed anesthesia with the patient or authorized representative who has indicated his/her understanding and acceptance.     Dental advisory given  Plan Discussed with: CRNA  Anesthesia Plan Comments: (Patient consented for risks of anesthesia including but not limited to:  - adverse reactions to medications - damage to eyes, teeth, lips or other oral  mucosa - nerve damage due to positioning  - sore throat or hoarseness - damage to heart, brain, nerves, lungs, other parts of body or loss of life  Informed patient about role of CRNA in peri- and intra-operative care.  Patient voiced understanding.)         Anesthesia Quick Evaluation

## 2023-11-19 ENCOUNTER — Encounter: Payer: Self-pay | Admitting: Surgery

## 2023-11-19 ENCOUNTER — Telehealth: Payer: Self-pay | Admitting: *Deleted

## 2023-11-19 NOTE — Telephone Encounter (Signed)
 The patient states that she has an appointment for genetics on 724 9 AM and then she has to be at the surgeon at 10 and she feels like it is going to tight to get it done but she would like to change the genetic date to August 5 because she is coming in and seeing Dr. Rennie and Dr. JAYSON Collard hrystal and she would like that 1 better if you have available

## 2023-11-23 LAB — SURGICAL PATHOLOGY

## 2023-11-24 ENCOUNTER — Telehealth: Payer: Self-pay | Admitting: *Deleted

## 2023-11-24 NOTE — Telephone Encounter (Signed)
 Submitted Oncotype DX through [facility]. Faxed request and medical records.

## 2023-11-25 NOTE — Progress Notes (Unsigned)
 [facility] POST-OP OFFICE VISIT  11/26/2023  HPI: Yolanda Gill is a 53 y.o. female had surgery on  November 18, 2023 , now s/p right scout tagged localized lumpectomy with right axillary sentinel LN Bx.   she has no complaints or issues, Dermabond is intact and it is somewhat itchy.  Pathology:  FINAL DIAGNOSIS       1. 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       2. Lymph node, sentinel, biopsy, #1, from right axilla :      - ONE LYMPH NODE, NEGATIVE FOR CARCINOMA (0/1).       3. Lymph node, sentinel, biopsy, #2 from right axilla :      - ONE LYMPH NODE, NEGATIVE FOR CARCINOMA (0/1).       4. Lymph node, sentinel, biopsy, #3 from right axilla :      - ONE LYMPH NODE, NEGATIVE FOR CARCINOMA (0/1).      - IMMUNOHISTOCHEMICAL STAIN FOR AE1/AE3 IS NEGATIVE, CONSISTENT WITH THE      DIAGNOSIS.       5. Lymph node, sentinel, biopsy, #4 from right axilla :      - ONE LYMPH NODE, NEGATIVE FOR CARCINOMA (0/1).   Vital signs: BP (!) 142/83   Pulse 68   Temp 97.9 F (36.6 C) (Oral)   Ht 5' 8 (1.727 m)   Wt 186 lb 9.6 oz (84.6 kg)   LMP 09/16/2023 (Approximate) Comment: 11/18/23 negative urine pregnancy test  SpO2 98%   BMI 28.37 kg/m    Physical Exam: Constitutional: She looks great, no issues.  She presents today with her husband.  Skin: Right breast and right axillary incisions appear clean dry and intact.  There is no significant  ecchymosis, appreciable seroma and no erythema whatsoever.  Assessment/Plan: This is a 53 y.o. female status post Scout tagged right breast lumpectomy upper central breast.  Status post right axillary sentinel lymph node biopsy.  Margins as noted above.  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  We discussed follow-up mammography in 6 months and assured them this is baseline.  We remain readily available to see them as needed.   [name] M.D., FACS 11/26/2023, 2:50 PM

## 2023-11-26 ENCOUNTER — Encounter: Admitting: Licensed Clinical Social Worker

## 2023-11-26 ENCOUNTER — Ambulatory Visit (INDEPENDENT_AMBULATORY_CARE_PROVIDER_SITE_OTHER): Admitting: Surgery

## 2023-11-26 ENCOUNTER — Encounter: Payer: Self-pay | Admitting: Surgery

## 2023-11-26 VITALS — BP 142/83 | HR 68 | Temp 97.9°F | Ht 68.0 in | Wt 186.6 lb

## 2023-11-26 DIAGNOSIS — Z08 Encounter for follow-up examination after completed treatment for malignant neoplasm: Secondary | ICD-10-CM

## 2023-11-26 DIAGNOSIS — Z170 Estrogen receptor positive status [ER+]: Secondary | ICD-10-CM

## 2023-11-26 DIAGNOSIS — C50911 Malignant neoplasm of unspecified site of right female breast: Secondary | ICD-10-CM

## 2023-11-26 NOTE — Patient Instructions (Addendum)
 We will see you back in about 6 months. Our schedule is not yet available so we placed you in our recall system and will send you out a letter with a date and time to come see Dr Lane and to have your mammogram  Please call the office if you have any questions or concerns.    GENERAL POST-OPERATIVE PATIENT INSTRUCTIONS   WOUND CARE INSTRUCTIONS:  Keep a dry clean dressing on the wound if there is drainage. The initial bandage may be removed after 24 hours.  Once the wound has quit draining you may leave it open to air.  If clothing rubs against the wound or causes irritation and the wound is not draining you may cover it with a dry dressing during the daytime.  Try to keep the wound dry and avoid ointments on the wound unless directed to do so.  If the wound becomes bright red and painful or starts to drain infected material that is not clear, please contact your physician immediately.  If the wound is mildly pink and has a thick firm ridge underneath it, this is normal, and is referred to as a healing ridge.  This will resolve over the next 4-6 weeks.  BATHING: You may shower if you have been informed of this by your surgeon. However, Please do not submerge in a tub, hot tub, or pool until incisions are completely sealed or have been told by your surgeon that you may do so.  DIET:  You may eat any foods that you can tolerate.  It is a good idea to eat a high fiber diet and take in plenty of fluids to prevent constipation.  If you do become constipated you may want to take a mild laxative or take ducolax tablets on a daily basis until your bowel habits are regular.  Constipation can be very uncomfortable, along with straining, after recent surgery.  ACTIVITY:  You are encouraged to cough and deep breath or use your incentive spirometer if you were given one, every 15-30 minutes when awake.  This will help prevent respiratory complications and low grade fevers post-operatively if you had a  general anesthetic.  You may want to hug a pillow when coughing and sneezing to add additional support to the surgical area, if you had abdominal or chest surgery, which will decrease pain during these times.  You are encouraged to walk and engage in light activity for the next two weeks.  You should not lift more than 20 pounds for 6 weeks total after surgery as it could put you at increased risk for complications.  Twenty pounds is roughly equivalent to a plastic bag of groceries. At that time- Listen to your body when lifting, if you have pain when lifting, stop and then try again in a few days. Soreness after doing exercises or activities of daily living is normal as you get back in to your normal routine.  MEDICATIONS:  Try to take narcotic medications and anti-inflammatory medications, such as tylenol , ibuprofen , naprosyn, etc., with food.  This will minimize stomach upset from the medication.  Should you develop nausea and vomiting from the pain medication, or develop a rash, please discontinue the medication and contact your physician.  You should not drive, make important decisions, or operate machinery when taking narcotic pain medication.  SUNBLOCK Use sun block to incision area over the next year if this area will be exposed to sun. This helps decrease scarring and will allow you avoid a permanent  darkened area over your incision.  QUESTIONS:  Please feel free to call our office if you have any questions, and we will be glad to assist you. [phone]

## 2023-12-02 ENCOUNTER — Encounter: Payer: Self-pay | Admitting: Surgery

## 2023-12-02 ENCOUNTER — Encounter: Payer: Self-pay | Admitting: *Deleted

## 2023-12-02 ENCOUNTER — Ambulatory Visit: Attending: Internal Medicine | Admitting: Occupational Therapy

## 2023-12-02 DIAGNOSIS — R293 Abnormal posture: Secondary | ICD-10-CM | POA: Diagnosis present

## 2023-12-02 DIAGNOSIS — Z170 Estrogen receptor positive status [ER+]: Secondary | ICD-10-CM | POA: Insufficient documentation

## 2023-12-02 DIAGNOSIS — C50211 Malignant neoplasm of upper-inner quadrant of right female breast: Secondary | ICD-10-CM | POA: Diagnosis present

## 2023-12-02 NOTE — Therapy (Signed)
 OUTPATIENT OCCUPATIONAL THERAPY BREAST CANCER POSTOP  TREATMENT   Patient Name: Yolanda Gill MRN: [id number] DOB:Feb 19, 1971, 53 y.o., female Today's Date: 12/02/2023  END OF SESSION:  OT End of Session - 12/02/23 1750     Visit Number 2    Number of Visits 5    Date for OT Re-Evaluation 01/20/24    OT Start Time 0830    OT Stop Time 0854    OT Time Calculation (min) 24 min    Activity Tolerance Patient tolerated treatment well    Behavior During Therapy WFL for tasks assessed/performed          Past Medical History:  Diagnosis Date   Cancer (HCC)    breast   Hypertension    Hypothyroidism    Thyroid disease    Past Surgical History:  Procedure Laterality Date   AXILLARY SENTINEL NODE BIOPSY Right 11/18/2023   Procedure: BIOPSY, LYMPH NODE, SENTINEL, AXILLARY;  Surgeon: Lane Shope, MD;  Location: [facility];  Service: General;  Laterality: Right;   BREAST BIOPSY Left 08/2022   Atypical lobular hyperplasia   BREAST BIOPSY Bilateral 10/19/2023   2 u/s bx lt (ribbon) rt ( ) one stereo (x) clip   BREAST BIOPSY Right 10/19/2023   US  RT BREAST BX W LOC DEV 1ST LESION IMG BX SPEC US  GUIDE 10/19/2023 [facility]   BREAST BIOPSY Left 10/19/2023   MM LT BREAST BX W LOC DEV 1ST LESION IMAGE BX SPEC STEREO GUIDE 10/19/2023 [facility]   BREAST BIOPSY Left 10/19/2023   US  LT BREAST BX W LOC DEV EA ADD LESION IMG BX SPEC US  GUIDE 10/19/2023 [facility]   BREAST BIOPSY Right 11/02/2023   MM RT BREAST SAVI/RF TAG 1ST LESION MAMMO GUIDE 11/02/2023 [facility]   BREAST LUMPECTOMY WITH RADIO FREQUENCY LOCALIZER Right 11/18/2023   Procedure: BREAST LUMPECTOMY WITH RADIO FREQUENCY LOCALIZER;  Surgeon: Lane Shope, MD;  Location: [facility];  Service: General;  Laterality: Right;   BREAST SURGERY Left 10/31/2013   Radial scar without atypia   COLONOSCOPY     FINE NEEDLE ASPIRATION Left    when she was in college - left breast   WISDOM TOOTH EXTRACTION  05/05/1988    Patient Active Problem List   Diagnosis Date Noted   Malignant neoplasm of right breast in female, estrogen receptor positive (HCC) 11/18/2023   Carcinoma of upper-inner quadrant of right breast in female, estrogen receptor positive (HCC) 10/27/2023   At high risk for breast cancer 09/09/2022   Radial scar of breast 11/07/2013    PCP: DR Lenon MART PROVIDER: Dr Rennie  REFERRING DIAG: R breast Cancer  THERAPY DIAG:  Abnormal posture  Carcinoma of upper-inner quadrant of right breast in female, estrogen receptor positive [facility])  Rationale for Evaluation and Treatment: Rehabilitation  ONSET DATE: June 25  SUBJECTIVE:  SUBJECTIVE STATEMENT: I am doing really good.  I feel like a slight pull reaching  overhead. But not bad I have been since the surgery only took 5 days of  PERTINENT HISTORY:  Patient was diagnosed with right  breast cancer -patient had a right lumpectomy by Dr. Lane on 11/18/2023.  1 of 2 lymph nodes was removed.  Last appointment with Dr. Lane 7/24/doing really well.  Plan for radiation PATIENT GOALS:   reduce lymphedema risk and learn post op HEP.   PAIN:  Are you having pain? NO  PRECAUTIONS: Active CA      HAND DOMINANCE: right  WEIGHT BEARING RESTRICTIONS: No  FALLS:  Has patient fallen in last 6 months? 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     OBJECTIVE:  COGNITION: Overall cognitive status: Within functional limits for tasks assessed    POSTURE:  Forward head and rounded shoulders posture  UPPER EXTREMITY AROM/PROM:  A/PROM RIGHT   12/02/23   Shoulder extension   Shoulder flexion 180  Shoulder abduction 180   Shoulder internal rotation   Shoulder external rotation 90    (Blank rows = not tested)  A/PROM LEFT   12/02/23  Shoulder extension   Shoulder flexion 180  Shoulder abduction 180  Shoulder internal rotation   Shoulder external rotation 90    (Blank rows = not tested)  CERVICAL AROM: All within normal limits:     UPPER EXTREMITY STRENGTH: Bilateral upper extremity strength within normal limits 5/5 with external or internal rotation 5 -/5 at eval   LYMPHEDEMA ASSESSMENTS:      L-DEX LYMPHEDEMA SCREENING:  The patient was assessed using the L-Dex machine today to produce a lymphedema index baseline score. The patient will be reassessed on a regular basis (typically every 3 months) to obtain new L-Dex scores. If the score is > 6.5 points away from his/her baseline score indicating onset of subclinical lymphedema, it will be recommended to wear a compression garment for 4 weeks, 12 hours per day and then be reassessed. If the score continues to be > 6.5 points from baseline at reassessment, we will initiate lymphedema treatment. Assessing in this manner has a 95% rate of preventing clinically significant lymphedema.   L-DEX FLOWSHEETS - 12/02/23 1700       L-DEX LYMPHEDEMA SCREENING   Measurement Type Unilateral    L-DEX MEASUREMENT EXTREMITY Upper Extremity    POSITION  Standing    DOMINANT SIDE Right    At Risk Side Right    BASELINE SCORE (UNILATERAL) 3.7           PATIENT EDUCATION:  Education details: Lymphedema risk reduction  and post op shoulder/posture HEP done Person educated: Patient Education method: Explanation, Demonstration, Handout Education comprehension: Patient verbalized understanding and returned demonstration  SESSION 12/02/23  Patient arrived with active range of motion within normal limits.  With a slight pull over lateral incision on the right breast.  No pain reported.  Some bruising around the nipple.  Glue still intact. Patient  home  exercise program upgraded for shoulder  active assist shoulder flexion and ABD on the wall and continue with external rotation in supine -patient doing really well with active range of motion.  Recommend for patient to continue while doing radiation for active assisted range of motion daily.  Patient was also educated in lymphedema signs and symptoms as well  as precautions and prevention.  Handout that was in the cancer journal reviewed with patient and provided.  Patient verbalized understanding. L-Dex done within range.  See above  ASSESSMENT:  CLINICAL IMPRESSION: Patient presents for follow-up after having right lumpectomy by Dr. Lane on 11/18/2023.  Patient presented with active range of motion within normal limits with a slight pull.  Patient returned to work after 5 days and has been working since then.  Reports no issues.  Recommended for patient active assisted range of motion to be performed daily throughout radiation.  Patient was also educated on lymphedema signs and symptoms as well as prevention.  Handout provided.  And reviewed.  Patient L-Dex score within normal limits and range.  Patient can follow-up with breast navigator after radiation  L-Dex screens every 3 months for 2 years to detect subclinical lymphedema.  Send follow-up with me as needed.  Pt will benefit from skilled therapeutic intervention to improve on the following deficits: Decreased knowledge of precautions and lymphedema education, impaired UE functional use, pain, decreased ROM, postural dysfunction.   OT treatment/interventions: ADL/self-care home management, pt/family education, therapeutic exercise,manual therapy  REHAB POTENTIAL: Good  CLINICAL DECISION MAKING: Stable/uncomplicated  EVALUATION COMPLEXITY: Low   GOALS: Goals reviewed with patient? YES  LONG TERM GOALS: (STG=LTG)    Name Target Date Goal status  1 Pt will be able to verbalize understanding of pertinent lymphedema risk reduction  practices relevant to her dx specifically related to skin care.  Baseline:  No knowledge 8 wks Achieved  2 Pt will be able to return demo and/or verbalize understanding of the post op HEP related to regaining shoulder ROM. Baseline:  No knowledge today Achieved at eval       4 Pt will demo she has regained full shoulder ROM and function post operatively compared to baselines.  Baseline: See objective measurements taken today. 8 wks Achieve    PLAN:  OT FREQUENCY/DURATION: EVAL and 3 follow up appointment.   PLAN FOR NEXT SESSION: will reassess 3-4 weeks post op to determine needs.   Patient will follow up at outpatient cancer rehab 3-4 weeks following surgery.  If the patient requires occupational therapy at that time, a specific plan will be dictated and sent to the referring physician for approval. T Occupational Therapy Information for After Breast Cancer Surgery/Treatment:  Lymphedema is a swelling condition that you may be at risk for in your arm if you have lymph nodes removed from the armpit area.  After a sentinel node biopsy, the risk is approximately 5-9% and is higher after an axillary node dissection.  There is treatment available for this condition and it is not life-threatening.  Contact your physician or occupational therapist with concerns. You may begin the 4 shoulder/posture exercises (see additional sheet) when permitted by your physician (typically a week after surgery).  If you have drains, you may need to wait until those are removed before beginning range of motion exercises.  A general recommendation is to not lift your arms above shoulder height until drains are removed.  These exercises should be done to your tolerance and gently.  This is not a no pain/no gain type of recovery so listen to your body and stretch into the range of motion that you can tolerate, stopping if you have pain.  If you are having immediate reconstruction, ask your plastic surgeon about doing  exercises as he or she may want you to wait. .  While undergoing any medical procedure or treatment, try  to avoid blood pressure being taken or needle sticks from occurring on the arm on the side of cancer.   This recommendation begins after surgery and continues for the rest of your life.  This may help reduce your risk of getting lymphedema (swelling in your arm). An excellent resource for those seeking information on lymphedema is the National Lymphedema Network's web site. It can be accessed at www.lymphnet.org If you notice swelling in your hand, arm or breast at any time following surgery (even if it is many years from now), please contact your doctor or occupational therapist to discuss this.  Lymphedema can be treated at any time but it is easier for you if it is treated early on.  If you feel like your shoulder motion is not returning to normal in a reasonable amount of time, please contact your surgeon or occupational therapist.  [facility] [phone]. [address] [place], [place] [address]  Patient was instructed today in a home exercise program today for post op shoulder range of motion. These included active assist shoulder flexion in standing/supine, scapular retraction, wall walking/slides with shoulder abduction, and hands behind head external rotation in supine.  She was encouraged to do these 2-3 x day, holding 3 seconds and repeating 10 times when permitted by her physician/surgeon      Ancel Peters, OTR/L,CLT 12/02/2023, 5:57 PM

## 2023-12-02 NOTE — Progress Notes (Signed)
 Spoke with Merchant navy officer at [facility].   He will correct the oncotype order to the invasive breast cancer recurrence score.   No further actions are needed at this time.

## 2023-12-08 ENCOUNTER — Institutional Professional Consult (permissible substitution): Admitting: Radiation Oncology

## 2023-12-08 ENCOUNTER — Encounter: Payer: Self-pay | Admitting: *Deleted

## 2023-12-08 ENCOUNTER — Other Ambulatory Visit: Payer: Self-pay | Admitting: Licensed Clinical Social Worker

## 2023-12-08 ENCOUNTER — Encounter: Payer: Self-pay | Admitting: Licensed Clinical Social Worker

## 2023-12-08 ENCOUNTER — Inpatient Hospital Stay: Admitting: Licensed Clinical Social Worker

## 2023-12-08 ENCOUNTER — Encounter: Payer: Self-pay | Admitting: Internal Medicine

## 2023-12-08 ENCOUNTER — Inpatient Hospital Stay

## 2023-12-08 ENCOUNTER — Inpatient Hospital Stay: Admitting: Internal Medicine

## 2023-12-08 VITALS — BP 116/82 | HR 71 | Temp 96.9°F | Resp 12 | Ht 68.0 in | Wt 189.0 lb

## 2023-12-08 DIAGNOSIS — Z803 Family history of malignant neoplasm of breast: Secondary | ICD-10-CM | POA: Insufficient documentation

## 2023-12-08 DIAGNOSIS — Z8349 Family history of other endocrine, nutritional and metabolic diseases: Secondary | ICD-10-CM | POA: Diagnosis not present

## 2023-12-08 DIAGNOSIS — Z170 Estrogen receptor positive status [ER+]: Secondary | ICD-10-CM

## 2023-12-08 DIAGNOSIS — Z8042 Family history of malignant neoplasm of prostate: Secondary | ICD-10-CM

## 2023-12-08 DIAGNOSIS — Z1732 Human epidermal growth factor receptor 2 negative status: Secondary | ICD-10-CM | POA: Diagnosis not present

## 2023-12-08 DIAGNOSIS — C50211 Malignant neoplasm of upper-inner quadrant of right female breast: Secondary | ICD-10-CM | POA: Diagnosis present

## 2023-12-08 DIAGNOSIS — Z8249 Family history of ischemic heart disease and other diseases of the circulatory system: Secondary | ICD-10-CM | POA: Diagnosis not present

## 2023-12-08 DIAGNOSIS — Z510 Encounter for antineoplastic radiation therapy: Secondary | ICD-10-CM | POA: Diagnosis present

## 2023-12-08 DIAGNOSIS — Z79899 Other long term (current) drug therapy: Secondary | ICD-10-CM | POA: Diagnosis not present

## 2023-12-08 DIAGNOSIS — Z1722 Progesterone receptor negative status: Secondary | ICD-10-CM | POA: Diagnosis not present

## 2023-12-08 LAB — GENETIC SCREENING ORDER

## 2023-12-08 NOTE — Progress Notes (Signed)
 [facility] CONSULT NOTE  Patient Care Team: Lenon Layman ORN, MD as PCP - General (Internal Medicine) Schermerhorn, Debby PARAS, MD as Referring Physician (Obstetrics and Gynecology) Dessa, Reyes ORN, MD (General Surgery) Georgina Shasta POUR, RN as Oncology Nurse Navigator Rennie Cindy SAUNDERS, MD as Consulting Physician (Oncology) Lenn Aran, MD as Consulting Physician (Radiation Oncology) Lenn Aran, MD as Consulting Physician (Radiation Oncology)  CHIEF COMPLAINTS/PURPOSE OF CONSULTATION: Breast cancer  #  Oncology History Overview Note  IMPRESSION: 1. In the RIGHT breast, there is a subtle area of architectural distortion with a favored 7 mm sonographic correlate at 1 o'clock. Recommend ultrasound-guided biopsy for definitive characterization. Recommend attention on post marker placement mammogram to assess for mammographic/sonographic correlation. 2. There is a 5 mm group of indeterminate calcifications in the LEFT breast. Recommend stereotactic guided biopsy for definitive characterization. 3. Unchanged probably benign LEFT 4 mm breast mass at 3 o'clock 7 cm from the nipple. Option for continued follow-up versus definitive characterization with biopsy was discussed with patient. Patient would prefer to proceed with definitive characterization at this point in time. As such, recommend ultrasound-guided biopsy for definitive characterization. 4. 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 Interval resolution of the second adjacent mass, consistent with a benign etiology. 5. Additional benign LEFT breast cyst is noted at 3 o'clock 7 cm from the nipple. 6. No suspicious RIGHT axillary adenopathy. 7. Given history of atypical lobular hyperplasia, recommend consideration of supplemental screening with breast MRI with and without contrast.   RECOMMENDATION: 1. RIGHT breast ultrasound-guided biopsy x1 2. LEFT breast ultrasound-guided  biopsy x1 3. LEFT breast stereotactic guided biopsy x1   I have discussed the findings and recommendations with the patient. The biopsy procedure was discussed with the patient and questions were answered. Patient expressed their understanding of the biopsy recommendation. Patient will be scheduled for biopsy at her earliest convenience by the schedulers. Ordering provider will be notified. If applicable, a reminder letter will be sent to the patient regarding the next appointment.   BI-RADS CATEGORY  4: Suspicious.   Electronically Signed: By: Corean Salter M.D. On: 10/08/2023 16:27  1. 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       - TUBULE FORMATION: SCORE 1       - NUCLEAR PLEOMORPHISM: SCORE 2       - MITOTIC COUNT: SCORE 1       - TOTAL SCORE: 4       - OVERALL GRADE: 1       - LYMPHOVASCULAR INVASION: NOT IDENTIFIED       - CANCER LENGTH: 7 MM       - CALCIFICATIONS: PRESENT       - DUCTAL CARCINOMA IN SITU: PRESENT, INTERMEDIATE GRADE       - SEE NOTE.   ER-100; PR-95%; her 2 NEG   Carcinoma of upper-inner quadrant of right breast in female, estrogen receptor positive (HCC)  10/27/2023 Initial Diagnosis   Carcinoma of upper-inner quadrant of right breast in female, estrogen receptor positive (HCC)   10/27/2023 Cancer Staging   Staging form: Breast, AJCC 8th Edition - Pathologic: Stage IA (pT2, pN0, cM0, G1, ER+, PR+, HER2-) - Signed by Rennie Cindy SAUNDERS, MD on 12/08/2023 Histologic grading system: 3 grade system     HISTORY OF PRESENTING ILLNESS: Patient ambulating-independently. Alone.   Yolanda Gill 53 y.o.  female  perimenopausal with no prior history of stage I right-sided ER/PR positive HER2 negative breast cancer is here reviewed the results of her lumpectomy.  Patient is healing well from surgery.  Denies any infection issues.  Review of Systems   Constitutional:  Negative for chills, diaphoresis, fever, malaise/fatigue and weight loss.  HENT:  Negative for nosebleeds and sore throat.   Eyes:  Negative for double vision.  Respiratory:  Negative for cough, hemoptysis, sputum production, shortness of breath and wheezing.   Cardiovascular:  Negative for chest pain, palpitations, orthopnea and leg swelling.  Gastrointestinal:  Negative for abdominal pain, blood in stool, constipation, diarrhea, heartburn, melena, nausea and vomiting.  Genitourinary:  Negative for dysuria, frequency and urgency.  Musculoskeletal:  Negative for back pain and joint pain.  Skin: Negative.  Negative for itching and rash.  Neurological:  Negative for dizziness, tingling, focal weakness, weakness and headaches.  Endo/Heme/Allergies:  Does not bruise/bleed easily.  Psychiatric/Behavioral:  Negative for depression. The patient is not nervous/anxious and does not have insomnia.      MEDICAL HISTORY:  Past Medical History:  Diagnosis Date   Cancer (HCC)    breast   Hypertension    Hypothyroidism    Thyroid disease     SURGICAL HISTORY: Past Surgical History:  Procedure Laterality Date   AXILLARY SENTINEL NODE BIOPSY Right 11/18/2023   Procedure: BIOPSY, LYMPH NODE, SENTINEL, AXILLARY;  Surgeon: Lane Shope, MD;  Location: [facility];  Service: General;  Laterality: Right;   BREAST BIOPSY Left 08/2022   Atypical lobular hyperplasia   BREAST BIOPSY Bilateral 10/19/2023   2 u/s bx lt (ribbon) rt ( ) one stereo (x) clip   BREAST BIOPSY Right 10/19/2023   US  RT BREAST BX W LOC DEV 1ST LESION IMG BX SPEC US  GUIDE 10/19/2023 [facility]   BREAST BIOPSY Left 10/19/2023   MM LT BREAST BX W LOC DEV 1ST LESION IMAGE BX SPEC STEREO GUIDE 10/19/2023 [facility]   BREAST BIOPSY Left 10/19/2023   US  LT BREAST BX W LOC DEV EA ADD LESION IMG BX SPEC US  GUIDE 10/19/2023 [facility]   BREAST BIOPSY Right 11/02/2023   MM RT BREAST SAVI/RF TAG 1ST LESION  MAMMO GUIDE 11/02/2023 [facility]   BREAST LUMPECTOMY WITH RADIO FREQUENCY LOCALIZER Right 11/18/2023   Procedure: BREAST LUMPECTOMY WITH RADIO FREQUENCY LOCALIZER;  Surgeon: Lane Shope, MD;  Location: [facility];  Service: General;  Laterality: Right;   BREAST SURGERY Left 10/31/2013   Radial scar without atypia   COLONOSCOPY     FINE NEEDLE ASPIRATION Left    when she was in college - left breast   WISDOM TOOTH EXTRACTION  05/05/1988    SOCIAL HISTORY: Social History   Socioeconomic History   Marital status: Divorced    Spouse name: Not on file   Number of children: Not on file   Years of education: Not on file   Highest education level: Not on file  Occupational History   Not on file  Tobacco Use   Smoking status: Never   Smokeless tobacco: Never  Vaping Use   Vaping status: Never Used  Substance and Sexual Activity   Alcohol use: No   Drug use: No   Sexual activity: Not on file  Other Topics Concern   Not on file  Social History Narrative   Lives at home with 30 yr old son   Social Drivers of Corporate investment banker Strain: Low Risk  (09/25/2022)   Received from [facility]  Overall Financial Resource Strain (CARDIA)    Difficulty of Paying Living Expenses: Not hard at all  Food Insecurity: No Food Insecurity (10/27/2023)   Hunger Vital Sign    Worried About Running Out of Food in the Last Year: Never true    Ran Out of Food in the Last Year: Never true  Transportation Needs: No Transportation Needs (10/27/2023)   PRAPARE - Administrator, Civil Service (Medical): No    Lack of Transportation (Non-Medical): No  Physical Activity: Not on file  Stress: Not on file  Social Connections: Not on file  Intimate Partner Violence: Not At Risk (10/27/2023)   Humiliation, Afraid, Rape, and Kick questionnaire    Fear of Current or Ex-Partner: No    Emotionally Abused: No    Physically Abused: No    Sexually Abused: No     FAMILY HISTORY: Family History  Problem Relation Age of Onset   Hypertension Mother    Thyroid disease Mother    Hypertension Father    Breast cancer Maternal Aunt        dx mid 38s   Cancer Maternal Grandfather        prostate vs GI vs other GU   Aneurysm Paternal Grandmother    Prostate cancer Paternal Grandfather        dx 38s-70s    ALLERGIES:  has no known allergies.  MEDICATIONS:  Current Outpatient Medications  Medication Sig Dispense Refill   cetirizine (ZYRTEC) 10 MG tablet Take 10 mg by mouth in the morning.     Cyanocobalamin (HM SUPER VITAMIN B12 PO) Take 1 tablet by mouth 2 (two) times a week.     levothyroxine (SYNTHROID, LEVOTHROID) 75 MCG tablet Take 75 mcg by mouth daily before breakfast.     losartan-hydrochlorothiazide (HYZAAR) 100-12.5 MG tablet Take 1 tablet by mouth in the morning.     No current facility-administered medications for this visit.   PHYSICAL EXAMINATION:   Vitals:   12/08/23 1327  BP: 116/82  Pulse: 71  Resp: 12  Temp: (!) 96.9 F (36.1 C)  SpO2: 99%    Filed Weights   12/08/23 1327  Weight: 189 lb (85.7 kg)     Physical Exam Vitals and nursing note reviewed.  HENT:     Head: Normocephalic and atraumatic.     Mouth/Throat:     Pharynx: Oropharynx is clear.  Eyes:     Extraocular Movements: Extraocular movements intact.     Pupils: Pupils are equal, round, and reactive to light.  Cardiovascular:     Rate and Rhythm: Normal rate and regular rhythm.  Pulmonary:     Comments: Decreased breath sounds bilaterally.  Abdominal:     Palpations: Abdomen is soft.  Musculoskeletal:        General: Normal range of motion.     Cervical back: Normal range of motion.  Skin:    General: Skin is warm.  Neurological:     General: No focal deficit present.     Mental Status: She is alert and oriented to person, place, and time.  Psychiatric:        Behavior: Behavior normal.        Judgment: Judgment normal.       LABORATORY DATA:  I have reviewed the data as listed Lab Results  Component Value Date   WBC 8.9 11/11/2023   HGB 13.2 11/11/2023   HCT 40.0 11/11/2023   MCV 86.6 11/11/2023   PLT 264 11/11/2023  Recent Labs    11/11/23 1357  NA 138  K 3.4*  CL 104  CO2 26  GLUCOSE 92  BUN 16  CREATININE 0.71  CALCIUM 9.4  GFRNONAA >60  PROT 7.5  ALBUMIN 3.8  AST 19  ALT 22  ALKPHOS 62  BILITOT 0.8    RADIOGRAPHIC STUDIES: I have personally reviewed the radiological images as listed and agreed with the findings in the report. MM Breast Surgical Specimen Result Date: 11/18/2023 CLINICAL DATA:  Status post [facility] localized right breast lumpectomy for invasive mammary carcinoma. EXAM: SPECIMEN RADIOGRAPH OF THE RIGHT BREAST COMPARISON:  Previous exam(s). FINDINGS: Status post excision of the right breast. The [facility] reflector and ribbon shaped clip are present within the specimen. IMPRESSION: Specimen radiograph of the right breast. Electronically Signed   By: Dirk Arrant M.D.   On: 11/18/2023 12:33   NM Sentinel Node Inj-No Rpt (Breast) Result Date: 11/18/2023 Lymphoseek was injected by the Nuclear Medicine Technologist for sentinel lymph node localization.    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 Lane.     # LEFT BREAST -atypical lobular hyperplasia.  Oncotype pending.  # Given the clinical characteristics-as likely patient will need chemotherapy.  However await Oncotype for final decision.  Patient awaiting radiation evaluation this week.  #Discussed the role of anti-hormonal therapy mechanism of action; since patient is premenopausal recommend tamoxifen.  I would recommend tamoxifen for 5 years.  Long  discussion regarding the potential adverse events on tamoxifen including but not limited to hot flashes, mood swings, thromboembolic events strokes and also small risk of uterine cancers.    # Genetics counseling: mom's sister- breast cancer. I at length discussed with the patient that majority of cancers are sporadic however 10 to 20% at risk of genetic/hereditary cancer syndromes.  Discussed importance of genetic counseling/genetic testing-given the therapeutic/preventative aspects.  Patient interested in genetic counseling.  Currently awaiting genetic counseling today.  Discussed with Therisa.   # DISPOSITION: # follow up in 2 months- MD; no labs- - Dr.B  All questions were answered. The patient/family knows to call the clinic with any problems, questions or concerns.   Cindy JONELLE Joe, MD 12/08/2023 4:31 PM

## 2023-12-08 NOTE — Progress Notes (Signed)
 REFERRING PROVIDER: Rennie Cindy SAUNDERS, MD [address] [place],  [place] [address]  PRIMARY PROVIDER:  Lenon Layman ORN, MD  PRIMARY REASON FOR VISIT:  1. Carcinoma of upper-inner quadrant of right breast in female, estrogen receptor positive (HCC)   2. Family history of breast cancer   3. Family history of prostate cancer      HISTORY OF PRESENT ILLNESS:   Yolanda Gill, a 53 y.o. female, was seen for a [place] cancer genetics consultation at the request of Dr. Rennie due to a personal and family history of cancer.  Yolanda Gill presents to clinic today to discuss the possibility of a hereditary predisposition to cancer, genetic testing, and to further clarify her future cancer risks, as well as potential cancer risks for family members.   CANCER HISTORY:  In 2025, at the age of 17, Yolanda Gill was diagnosed with right breast cancer. The treatment plan includes lumpectomy completed on 7/16, adjuvant radiation and adjuvant antiestrogen therapy.   Oncology History Overview Note  IMPRESSION: 1. In the RIGHT breast, there is a subtle area of architectural distortion with a favored 7 mm sonographic correlate at 1 o'clock. Recommend ultrasound-guided biopsy for definitive characterization. Recommend attention on post marker placement mammogram to assess for mammographic/sonographic correlation. 2. There is a 5 mm group of indeterminate calcifications in the LEFT breast. Recommend stereotactic guided biopsy for definitive characterization. 3. Unchanged probably benign LEFT 4 mm breast mass at 3 o'clock 7 cm from the nipple. Option for continued follow-up versus definitive characterization with biopsy was discussed with patient. Patient would prefer to proceed with definitive characterization at this point in time. As such, recommend ultrasound-guided biopsy for definitive characterization. 4. 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 Interval resolution  of the second adjacent mass, consistent with a benign etiology. 5. Additional benign LEFT breast cyst is noted at 3 o'clock 7 cm from the nipple. 6. No suspicious RIGHT axillary adenopathy. 7. Given history of atypical lobular hyperplasia, recommend consideration of supplemental screening with breast MRI with and without contrast.   RECOMMENDATION: 1. RIGHT breast ultrasound-guided biopsy x1 2. LEFT breast ultrasound-guided biopsy x1 3. LEFT breast stereotactic guided biopsy x1   I have discussed the findings and recommendations with the patient. The biopsy procedure was discussed with the patient and questions were answered. Patient expressed their understanding of the biopsy recommendation. Patient will be scheduled for biopsy at her earliest convenience by the schedulers. Ordering provider will be notified. If applicable, a reminder letter will be sent to the patient regarding the next appointment.   BI-RADS CATEGORY  4: Suspicious.   Electronically Signed: By: Corean Salter M.D. On: 10/08/2023 16:27  1. 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       - TUBULE FORMATION: SCORE 1       - NUCLEAR PLEOMORPHISM: SCORE 2       - MITOTIC COUNT: SCORE 1       - TOTAL SCORE: 4       - OVERALL GRADE: 1       - LYMPHOVASCULAR INVASION: NOT IDENTIFIED       - CANCER LENGTH: 7 MM       - CALCIFICATIONS: PRESENT       - DUCTAL CARCINOMA IN SITU: PRESENT, INTERMEDIATE GRADE       - SEE NOTE.   ER-100; PR-95%; her 2 NEG  Carcinoma of upper-inner quadrant of right breast in female, estrogen receptor positive (HCC)  10/27/2023 Initial Diagnosis   Carcinoma of upper-inner quadrant of right breast in female, estrogen receptor positive (HCC)   10/27/2023 Cancer Staging   Staging form: Breast, AJCC 8th Edition - Pathologic: Stage IA (pT2, pN0, cM0, G1, ER+, PR+, HER2-) - Signed by Rennie Cindy SAUNDERS, MD on 12/08/2023 Histologic grading system: 3 grade system     RELEVANT MEDICAL HISTORY:  Menarche was at age 58.  First live birth at age 49.  Ovaries intact: yes.  Hysterectomy: no.  Menopausal status: perimenopausal.  Colonoscopy: yes; normal. Prior biopsy in 2024 of L breast - [facility].   Past Medical History:  Diagnosis Date   Cancer (HCC)    breast   Hypertension    Hypothyroidism    Thyroid disease     Past Surgical History:  Procedure Laterality Date   AXILLARY SENTINEL NODE BIOPSY Right 11/18/2023   Procedure: BIOPSY, LYMPH NODE, SENTINEL, AXILLARY;  Surgeon: Lane Shope, MD;  Location: [facility];  Service: General;  Laterality: Right;   BREAST BIOPSY Left 08/2022   Atypical lobular hyperplasia   BREAST BIOPSY Bilateral 10/19/2023   2 u/s bx lt (ribbon) rt ( ) one stereo (x) clip   BREAST BIOPSY Right 10/19/2023   US  RT BREAST BX W LOC DEV 1ST LESION IMG BX SPEC US  GUIDE 10/19/2023 [facility]   BREAST BIOPSY Left 10/19/2023   MM LT BREAST BX W LOC DEV 1ST LESION IMAGE BX SPEC STEREO GUIDE 10/19/2023 [facility]   BREAST BIOPSY Left 10/19/2023   US  LT BREAST BX W LOC DEV EA ADD LESION IMG BX SPEC US  GUIDE 10/19/2023 [facility]   BREAST BIOPSY Right 11/02/2023   MM RT BREAST SAVI/RF TAG 1ST LESION MAMMO GUIDE 11/02/2023 [facility]   BREAST LUMPECTOMY WITH RADIO FREQUENCY LOCALIZER Right 11/18/2023   Procedure: BREAST LUMPECTOMY WITH RADIO FREQUENCY LOCALIZER;  Surgeon: Lane Shope, MD;  Location: [facility];  Service: General;  Laterality: Right;   BREAST SURGERY Left 10/31/2013   Radial scar without atypia   COLONOSCOPY     FINE NEEDLE ASPIRATION Left    when she was in college - left breast   WISDOM TOOTH EXTRACTION  05/05/1988    FAMILY HISTORY:  We obtained a detailed, 4-generation family history.  Significant diagnoses are listed below: Family History  Problem Relation Age of Onset   Hypertension Mother    Thyroid disease  Mother    Hypertension Father    Breast cancer Maternal Aunt        dx mid 68s   Cancer Maternal Grandfather        prostate vs GI vs other GU   Aneurysm Paternal Grandmother    Prostate cancer Paternal Grandfather        dx 59s-70s   Yolanda Gill has 3 sons, 24, 21 and 61, and 1 daughter, 68. She has 1 brother, 40.   Yolanda Gill mother is living at 80. Patient has 1 maternal aunt, she had breast cancer in her mid 81s and has not had genetic testing. Patient's grandfather had cancer, either prostate or another GU/GI cancer, unsure.   Yolanda Gill's father passed at 59. Patient's paternal grandfather had prostate in his 38s-70s and passed at 45.  Yolanda Gill is unaware of previous family history of genetic testing for hereditary cancer risks. There is no reported Ashkenazi Jewish ancestry. There is no known consanguinity.    GENETIC COUNSELING ASSESSMENT: Yolanda Gill is a  53 y.o. female with a personal and family history of breast cancer which is somewhat suggestive of a hereditary cancer syndrome and predisposition to cancer. We, therefore, discussed and recommended the following at today's visit.   DISCUSSION: We discussed that, in general, most cancer is not inherited in families, but instead is sporadic or familial. Sporadic cancers occur by chance and typically happen at older ages (>50 years) as this type of cancer is caused by genetic changes acquired during an individual's lifetime. Some families have more cancers than would be expected by chance; however, the ages or types of cancer are not consistent with a known genetic mutation or known genetic mutations have been ruled out. This type of familial cancer is thought to be due to a combination of multiple genetic, environmental, hormonal, and lifestyle factors. While this combination of factors likely increases the risk of cancer, the exact source of this risk is not currently identifiable or testable.    We discussed that approximately 10% of  breast cancer is hereditary. Most cases of hereditary breast cancer are associated with BRCA1/BRCA2 genes, although there are other genes associated with hereditary cancer as well. Cancers and risks are gene specific. We discussed that testing is beneficial for several reasons including knowing about cancer risks, identifying potential screening and risk-reduction options that may be appropriate, and to understand if other family members could be at risk for cancer and allow them to undergo genetic testing.   We reviewed the characteristics, features and inheritance patterns of hereditary cancer syndromes. We also discussed genetic testing, including the appropriate family members to test, the process of testing, insurance coverage and turn-around-time for results. We discussed the implications of a negative, positive and/or variant of uncertain significant result. We recommended Yolanda Gill pursue genetic testing for the [facility] CancerNext-Expanded+RNA gene panel.   The CancerNext-Expanded gene panel offered by [facility] and includes sequencing, rearrangement, and RNA analysis for the following 77 genes: AIP, ALK, APC, ATM, AXIN2, BAP1, BARD1, BMPR1A, BRCA1, BRCA2, BRIP1, CDC73, CDH1, CDK4, CDKN1B, CDKN2A, CEBPA, CHEK2, CTNNA1, DDX41, DICER1, ETV6, FH, FLCN, GATA2, LZTR1, MAX, MBD4, MEN1, MET, MLH1, MSH2, MSH3, MSH6, MUTYH, NF1, NF2, NTHL1, PALB2, PHOX2B, PMS2, POT1, PRKAR1A, PTCH1, PTEN, RAD51C, RAD51D, RB1, RET, RPS20, RUNX1, SDHA, SDHAF2, SDHB, SDHC, SDHD, SMAD4, SMARCA4, SMARCB1, SMARCE1, STK11, SUFU, TMEM127, TP53, TSC1, TSC2, VHL, and WT1 (sequencing and deletion/duplication); EGFR, HOXB13, KIT, MITF, PDGFRA, POLD1, and POLE (sequencing only); EPCAM and GREM1 (deletion/duplication only).  Based on Yolanda Gill's personal and family history of cancer, she meets medical criteria for genetic testing. Despite that she meets criteria, she may still have an out of pocket cost.   PLAN: After considering the  risks, benefits, and limitations, Yolanda Gill provided informed consent to pursue genetic testing and the blood sample was sent to [facility] for analysis of the CancerNext-Expanded+RNA Panel. Results should be available within approximately 2-3 weeks' time, at which point they will be disclosed by telephone to Yolanda Gill, as will any additional recommendations warranted by these results. Yolanda Gill will receive a summary of her genetic counseling visit and a copy of her results once available. This information will also be available in Epic.   Yolanda Gill's questions were answered to her satisfaction today. Our contact information was provided should additional questions or concerns arise. Thank you for the referral and allowing us  to share in the care of your patient.   Dena Cary, MS, [facility] Genetic Counselor [email or web address] Phone: [phone]  50 minutes were spent on the  date of the encounter in service to the patient including preparation, face-to-face consultation, documentation and care coordination. Dr. Delinda was available for discussion regarding this case.   _______________________________________________________________________ For Office Staff:  Number of people involved in session: 1 Was an Intern/ student involved with case: no

## 2023-12-08 NOTE — Assessment & Plan Note (Addendum)
 JULY 2025- # Breast, right, stage I [pT2 pN0; M0- G-1 ER-100; PR-95%; her 2 NEG Her 2 NEG.- INVASIVE MAMMARY CARCINOMA, NO SPECIAL TYPE (DUCTAL); grade 1-status postlumpectomy Dr. Lane.     # LEFT BREAST -atypical lobular hyperplasia.  Oncotype pending.  # Given the clinical characteristics-as likely patient will need chemotherapy.  However await Oncotype for final decision.  Patient awaiting radiation evaluation this week.  #Discussed the role of anti-hormonal therapy mechanism of action; since patient is premenopausal recommend tamoxifen.  I would recommend tamoxifen for 5 years.  Long discussion regarding the potential adverse events on tamoxifen including but not limited to hot flashes, mood swings, thromboembolic events strokes and also small risk of uterine cancers.    # Genetics counseling: mom's sister- breast cancer. I at length discussed with the patient that majority of cancers are sporadic however 10 to 20% at risk of genetic/hereditary cancer syndromes.  Discussed importance of genetic counseling/genetic testing-given the therapeutic/preventative aspects.  Patient interested in genetic counseling.  Currently awaiting genetic counseling today.  Discussed with Therisa.   # DISPOSITION: # follow up in 2 months- MD; no labs- - Dr.B

## 2023-12-08 NOTE — Progress Notes (Signed)
 11/18/23 Right Lumpectomy, Savi Scout tag directed, sentinel lymph node biopsy, Dr. Lane.  11/02/23 breast MRI.  Pt asking how often she will have to come back for visits? Her co-pay is $100.  Also asking what is the next step in plan of care?  She wants an explanation of why she needs the genetics appt.

## 2023-12-08 NOTE — Progress Notes (Signed)
 Met with Yolanda Gill during follow up appt.

## 2023-12-09 ENCOUNTER — Ambulatory Visit: Admitting: Occupational Therapy

## 2023-12-09 ENCOUNTER — Inpatient Hospital Stay: Admitting: Occupational Therapy

## 2023-12-09 DIAGNOSIS — Z170 Estrogen receptor positive status [ER+]: Secondary | ICD-10-CM

## 2023-12-09 DIAGNOSIS — R293 Abnormal posture: Secondary | ICD-10-CM

## 2023-12-09 DIAGNOSIS — Z510 Encounter for antineoplastic radiation therapy: Secondary | ICD-10-CM | POA: Diagnosis not present

## 2023-12-09 NOTE — Therapy (Signed)
 OUTPATIENT OCCUPATIONAL THERAPY BREAST CANCER POSTOP  TREATMENT   Patient Name: Yolanda Gill MRN: [id number] DOB:Jan 31, 1971, 53 y.o., female Today's Date: 12/09/2023  END OF SESSION:  OT End of Session - 12/09/23 0946     Visit Number 3    Number of Visits 5    Date for OT Re-Evaluation 01/20/24    OT Start Time 0801    OT Stop Time 0826    OT Time Calculation (min) 25 min    Activity Tolerance Patient tolerated treatment well    Behavior During Therapy WFL for tasks assessed/performed          Past Medical History:  Diagnosis Date   Cancer (HCC)    breast   Hypertension    Hypothyroidism    Thyroid disease    Past Surgical History:  Procedure Laterality Date   AXILLARY SENTINEL NODE BIOPSY Right 11/18/2023   Procedure: BIOPSY, LYMPH NODE, SENTINEL, AXILLARY;  Surgeon: Lane Shope, MD;  Location: [facility];  Service: General;  Laterality: Right;   BREAST BIOPSY Left 08/2022   Atypical lobular hyperplasia   BREAST BIOPSY Bilateral 10/19/2023   2 u/s bx lt (ribbon) rt ( ) one stereo (x) clip   BREAST BIOPSY Right 10/19/2023   US  RT BREAST BX W LOC DEV 1ST LESION IMG BX SPEC US  GUIDE 10/19/2023 [facility]   BREAST BIOPSY Left 10/19/2023   MM LT BREAST BX W LOC DEV 1ST LESION IMAGE BX SPEC STEREO GUIDE 10/19/2023 [facility]   BREAST BIOPSY Left 10/19/2023   US  LT BREAST BX W LOC DEV EA ADD LESION IMG BX SPEC US  GUIDE 10/19/2023 [facility]   BREAST BIOPSY Right 11/02/2023   MM RT BREAST SAVI/RF TAG 1ST LESION MAMMO GUIDE 11/02/2023 [facility]   BREAST LUMPECTOMY WITH RADIO FREQUENCY LOCALIZER Right 11/18/2023   Procedure: BREAST LUMPECTOMY WITH RADIO FREQUENCY LOCALIZER;  Surgeon: Lane Shope, MD;  Location: [facility];  Service: General;  Laterality: Right;   BREAST SURGERY Left 10/31/2013   Radial scar without atypia   COLONOSCOPY     FINE NEEDLE ASPIRATION Left    when she was in college - left breast   WISDOM TOOTH EXTRACTION  05/05/1988    Patient Active Problem List   Diagnosis Date Noted   Malignant neoplasm of right breast in female, estrogen receptor positive (HCC) 11/18/2023   Carcinoma of upper-inner quadrant of right breast in female, estrogen receptor positive (HCC) 10/27/2023   At high risk for breast cancer 09/09/2022   Radial scar of breast 11/07/2013    PCP: DR Lenon MART PROVIDER: Dr Rennie  REFERRING DIAG: R breast Cancer  THERAPY DIAG:  Abnormal posture  Carcinoma of upper-inner quadrant of right breast in female, estrogen receptor positive [facility])  Rationale for Evaluation and Treatment: Rehabilitation  ONSET DATE: June 25  SUBJECTIVE:  SUBJECTIVE STATEMENT: I am doing really good.  My right breast feels heavier fuller.  But the glue came off and my motion feel okay.  But I have this tingling and pins-and-needles in my pinky ring finger.  And up my mid forearm towards my elbow.  In a tight feeling, full feeling.  PERTINENT HISTORY:  Patient was diagnosed with right  breast cancer -patient had a right lumpectomy by Dr. Lane on 11/18/2023.  1 of 2 lymph nodes was removed.  Last appointment with Dr. Lane 7/24/doing really well.  Plan for radiation PATIENT GOALS:   reduce lymphedema risk and learn post op HEP.   PAIN:  Are you having pain? NO  PRECAUTIONS: Active CA      HAND DOMINANCE: right  WEIGHT BEARING RESTRICTIONS: No  FALLS:  Has patient fallen in last 6 months? 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     OBJECTIVE:  COGNITION: Overall cognitive status: Within functional limits for tasks assessed    POSTURE:  Forward head and rounded shoulders posture  UPPER  EXTREMITY AROM/PROM:  A/PROM RIGHT   12/02/23   Shoulder extension   Shoulder flexion 180  Shoulder abduction 180  Shoulder internal rotation   Shoulder external rotation 90    (Blank rows = not tested)  A/PROM LEFT   12/02/23  Shoulder extension   Shoulder flexion 180  Shoulder abduction 180  Shoulder internal rotation   Shoulder external rotation 90    (Blank rows = not tested)  CERVICAL AROM: All within normal limits:     UPPER EXTREMITY STRENGTH: Bilateral upper extremity strength within normal limits 5/5 with external or internal rotation 5 -/5 at eval   LYMPHEDEMA ASSESSMENTS:      L-DEX LYMPHEDEMA SCREENING:  The patient was assessed using the L-Dex machine today to produce a lymphedema index baseline score. The patient will be reassessed on a regular basis (typically every 3 months) to obtain new L-Dex scores. If the score is > 6.5 points away from his/her baseline score indicating onset of subclinical lymphedema, it will be recommended to wear a compression garment for 4 weeks, 12 hours per day and then be reassessed. If the score continues to be > 6.5 points from baseline at reassessment, we will initiate lymphedema treatment. Assessing in this manner has a 95% rate of preventing clinically significant lymphedema.      PATIENT EDUCATION:  Education details: Lymphedema risk reduction  and post op shoulder/posture HEP done Person educated: Patient Education method: Explanation, Demonstration, Handout Education comprehension: Patient verbalized understanding and returned demonstration  SESSION 12/02/23  Patient arrived with active range of motion within normal limits.  With a slight pull over lateral incision on the right breast.  No pain reported.  Some bruising around the nipple.  Glue still intact. Patient  home exercise program upgraded for shoulder  active assist shoulder flexion and ABD on the wall and continue with external rotation in supine -patient doing  really well with active range of motion.  Recommend for patient to continue while doing radiation for active assisted range of motion daily.  Patient was also educated in lymphedema signs and symptoms as well as precautions and prevention.  Handout that was in the cancer journal reviewed with patient and provided.  Patient verbalized understanding. L-Dex done within range.  See above  ASSESSMENT:  CLINICAL IMPRESSION: Patient had right lumpectomy  by Dr. Lane on 11/18/2023.  Patient presented on 12/02/2023 with active range of motion within normal limits with a slight pull.  Patient returned to work after 5 days and has been working since then.  Patient returns today with reports of some sensory changes in the pinky and the ring finger into the forearm as well as increased tightness fullness.  Upon assessment appear patient irritated the ulnar nerve at the cubital tunnel.  Positive Tinel.  Patient was educated in postural changes and positioning at nighttime as well as during the day not propping up the elbow and not cradling it.  Provided patient also with forearm flexor stretches followed by ulnar nerve glides.  Patient can continue at home with stretches and nerve glides.   Recommended for patient active assisted range of motion to be performed daily throughout radiation.  Patient was also educated on lymphedema signs and symptoms as well as prevention last visit.  Handout provided.  And reviewed.  Patient L-Dex score within normal limits and range.  Patient can follow-up with breast navigator after radiation -and if need to see me for fibrosis or increased swelling in the right breast and follow-up with me again- L-Dex screens can be done every 3 months for 2 years to detect subclinical lymphedema.  Send follow-up with me as needed.  Pt will benefit from skilled therapeutic intervention to improve on the following deficits: Decreased knowledge of precautions and lymphedema education, impaired UE  functional use, pain, decreased ROM, postural dysfunction.   OT treatment/interventions: ADL/self-care home management, pt/family education, therapeutic exercise,manual therapy  REHAB POTENTIAL: Good  CLINICAL DECISION MAKING: Stable/uncomplicated  EVALUATION COMPLEXITY: Low   GOALS: Goals reviewed with patient? YES  LONG TERM GOALS: (STG=LTG)    Name Target Date Goal status  1 Pt will be able to verbalize understanding of pertinent lymphedema risk reduction practices relevant to her dx specifically related to skin care.  Baseline:  No knowledge 8 wks Achieved  2 Pt will be able to return demo and/or verbalize understanding of the post op HEP related to regaining shoulder ROM. Baseline:  No knowledge today Achieved at eval       4 Pt will demo she has regained full shoulder ROM and function post operatively compared to baselines.  Baseline: See objective measurements taken today. 8 wks Achieve    PLAN:  OT FREQUENCY/DURATION: EVAL and 3 follow up appointment.   PLAN FOR NEXT SESSION: will reassess 3-4 weeks post op to determine needs.   Patient will follow up at outpatient cancer rehab 3-4 weeks following surgery.  If the patient requires occupational therapy at that time, a specific plan will be dictated and sent to the referring physician for approval. T Occupational Therapy Information for After Breast Cancer Surgery/Treatment:  Lymphedema is a swelling condition that you may be at risk for in your arm if you have lymph nodes removed from the armpit area.  After a sentinel node biopsy, the risk is approximately 5-9% and is higher after an axillary node dissection.  There is treatment available for this condition and it is not life-threatening.  Contact your physician or occupational therapist with concerns. You may begin the 4 shoulder/posture exercises (see additional sheet) when permitted by your physician (typically a week after surgery).  If you have drains, you may need  to wait until those are removed before beginning range of motion exercises.  A general recommendation is to not lift your arms above shoulder height until drains are removed.  These  exercises should be done to your tolerance and gently.  This is not a no pain/no gain type of recovery so listen to your body and stretch into the range of motion that you can tolerate, stopping if you have pain.  If you are having immediate reconstruction, ask your plastic surgeon about doing exercises as he or she may want you to wait. .  While undergoing any medical procedure or treatment, try to avoid blood pressure being taken or needle sticks from occurring on the arm on the side of cancer.   This recommendation begins after surgery and continues for the rest of your life.  This may help reduce your risk of getting lymphedema (swelling in your arm). An excellent resource for those seeking information on lymphedema is the National Lymphedema Network's web site. It can be accessed at www.lymphnet.org If you notice swelling in your hand, arm or breast at any time following surgery (even if it is many years from now), please contact your doctor or occupational therapist to discuss this.  Lymphedema can be treated at any time but it is easier for you if it is treated early on.  If you feel like your shoulder motion is not returning to normal in a reasonable amount of time, please contact your surgeon or occupational therapist.  [facility] [phone]. [address], [place], [place] [address]  Patient was instructed today in a home exercise program today for post op shoulder range of motion. These included active assist shoulder flexion in standing/supine, scapular retraction, wall walking/slides with shoulder abduction, and hands behind head external rotation in supine.  She was encouraged to do these 2-3 x day, holding 3 seconds and repeating 10 times when permitted by her  physician/surgeon      Ancel Peters, OTR/L,CLT 12/09/2023, 9:47 AM

## 2023-12-10 ENCOUNTER — Ambulatory Visit
Admission: RE | Admit: 2023-12-10 | Discharge: 2023-12-10 | Disposition: A | Discharge status: Home / Self Care | Source: Ambulatory Visit | Attending: Radiation Oncology | Admitting: Radiation Oncology

## 2023-12-10 ENCOUNTER — Encounter: Payer: Self-pay | Admitting: Internal Medicine

## 2023-12-10 ENCOUNTER — Encounter: Payer: Self-pay | Admitting: Radiation Oncology

## 2023-12-10 ENCOUNTER — Ambulatory Visit: Payer: Self-pay | Admitting: Internal Medicine

## 2023-12-10 VITALS — Resp 16 | Wt 189.0 lb

## 2023-12-10 DIAGNOSIS — Z8249 Family history of ischemic heart disease and other diseases of the circulatory system: Secondary | ICD-10-CM | POA: Insufficient documentation

## 2023-12-10 DIAGNOSIS — Z79899 Other long term (current) drug therapy: Secondary | ICD-10-CM | POA: Insufficient documentation

## 2023-12-10 DIAGNOSIS — C50211 Malignant neoplasm of upper-inner quadrant of right female breast: Secondary | ICD-10-CM | POA: Insufficient documentation

## 2023-12-10 DIAGNOSIS — Z510 Encounter for antineoplastic radiation therapy: Secondary | ICD-10-CM | POA: Insufficient documentation

## 2023-12-10 DIAGNOSIS — Z1722 Progesterone receptor negative status: Secondary | ICD-10-CM | POA: Insufficient documentation

## 2023-12-10 DIAGNOSIS — Z1732 Human epidermal growth factor receptor 2 negative status: Secondary | ICD-10-CM | POA: Insufficient documentation

## 2023-12-10 DIAGNOSIS — Z803 Family history of malignant neoplasm of breast: Secondary | ICD-10-CM | POA: Insufficient documentation

## 2023-12-10 DIAGNOSIS — Z170 Estrogen receptor positive status [ER+]: Secondary | ICD-10-CM | POA: Insufficient documentation

## 2023-12-10 DIAGNOSIS — Z8349 Family history of other endocrine, nutritional and metabolic diseases: Secondary | ICD-10-CM | POA: Insufficient documentation

## 2023-12-10 DIAGNOSIS — Z8042 Family history of malignant neoplasm of prostate: Secondary | ICD-10-CM | POA: Insufficient documentation

## 2023-12-10 NOTE — Consult Note (Signed)
 NEW PATIENT EVALUATION  Name: Yolanda Gill  MRN: [id number]  Date:   12/10/2023     DOB: 23-Sep-1970   This 53 y.o. 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  REFERRING PHYSICIAN: Lenon Layman ORN, MD  CHIEF COMPLAINT:  Chief Complaint  Patient presents with   Breast Cancer    DIAGNOSIS: The encounter diagnosis was Carcinoma of upper-inner quadrant of right breast in female, estrogen receptor positive (HCC).   PREVIOUS INVESTIGATIONS:  Mammogram reviewed Clinical notes reviewed Pathology reports reviewed  HPI: Patient is a 53 year old female who presented with an abnormal mammogram of the right breast.  There was a subtle area of architectural distortion at 1:00.  There was also a 5 mm group of indeterminate calcification in the left breast.  Core biopsy of the right breast showed invasive mammary carcinoma no special type overall grade 1.  Ductal carcinoma was present.  Left breast showed columnar cell change and pseudoangiomatous stromal hyperplasia no evidence of malignancy.  She went on to have a wide local excision for a 2.4 cm grade 1 invasive ductal carcinoma.  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  Margins were clear at 2 mm for the invasive component and less than 1 mm from the DCIS component.  Tumor again was strongly ER/PR positive HER2/neu not overexpressed.  5 sentinel lymph nodes were sampled all negative for malignancy.  She has done well postoperatively.  Her Oncotype DX came back low risk for recurrence that she will not have systemic chemotherapy.  She specifically denies breast tenderness cough or bone pain.  She is seen today for radiation oncology opinion.  PLANNED TREATMENT REGIMEN: Right hypofractionated whole breast radiation  PAST MEDICAL HISTORY:  has a past  medical history of Cancer (HCC), Hypertension, Hypothyroidism, and Thyroid disease.    PAST SURGICAL HISTORY:  Past Surgical History:  Procedure Laterality Date   AXILLARY SENTINEL NODE BIOPSY Right 11/18/2023   Procedure: BIOPSY, LYMPH NODE, SENTINEL, AXILLARY;  Surgeon: Lane Shope, MD;  Location: [facility];  Service: General;  Laterality: Right;   BREAST BIOPSY Left 08/2022   Atypical lobular hyperplasia   BREAST BIOPSY Bilateral 10/19/2023   2 u/s bx lt (ribbon) rt ( ) one stereo (x) clip   BREAST BIOPSY Right 10/19/2023   US  RT BREAST BX W LOC DEV 1ST LESION IMG BX SPEC US  GUIDE 10/19/2023 [facility]   BREAST BIOPSY Left 10/19/2023   MM LT BREAST BX W LOC DEV 1ST LESION IMAGE BX SPEC STEREO GUIDE 10/19/2023 [facility]   BREAST BIOPSY Left 10/19/2023   US  LT BREAST BX W LOC DEV EA ADD LESION IMG BX SPEC US  GUIDE 10/19/2023 [facility]   BREAST BIOPSY Right 11/02/2023   MM RT BREAST SAVI/RF TAG 1ST LESION MAMMO GUIDE 11/02/2023 [facility]   BREAST LUMPECTOMY WITH RADIO FREQUENCY LOCALIZER Right 11/18/2023   Procedure: BREAST LUMPECTOMY WITH RADIO FREQUENCY LOCALIZER;  Surgeon: Lane Shope, MD;  Location: [facility];  Service: General;  Laterality: Right;   BREAST SURGERY Left 10/31/2013   Radial scar without atypia   COLONOSCOPY     FINE NEEDLE ASPIRATION Left    when she was in college - left breast   WISDOM TOOTH EXTRACTION  05/05/1988    FAMILY HISTORY: family history includes Aneurysm in her paternal grandmother; Breast cancer in her maternal  aunt; Cancer in her maternal grandfather; Hypertension in her father and mother; Prostate cancer in her paternal grandfather; Thyroid disease in her mother.  SOCIAL HISTORY:  reports that she has never smoked. She has never used smokeless tobacco. She reports that she does not drink alcohol and does not use drugs.  ALLERGIES: Patient has no known allergies.  MEDICATIONS:  Current Outpatient Medications   Medication Sig Dispense Refill   cetirizine (ZYRTEC) 10 MG tablet Take 10 mg by mouth in the morning.     Cyanocobalamin (HM SUPER VITAMIN B12 PO) Take 1 tablet by mouth 2 (two) times a week.     levothyroxine (SYNTHROID, LEVOTHROID) 75 MCG tablet Take 75 mcg by mouth daily before breakfast.     losartan-hydrochlorothiazide (HYZAAR) 100-12.5 MG tablet Take 1 tablet by mouth in the morning.     No current facility-administered medications for this encounter.    ECOG PERFORMANCE STATUS:  0 - Asymptomatic  REVIEW OF SYSTEMS: Patient denies any weight loss, fatigue, weakness, fever, chills or night sweats. Patient denies any loss of vision, blurred vision. Patient denies any ringing  of the ears or hearing loss. No irregular heartbeat. Patient denies heart murmur or history of fainting. Patient denies any chest pain or pain radiating to her upper extremities. Patient denies any shortness of breath, difficulty breathing at night, cough or hemoptysis. Patient denies any swelling in the lower legs. Patient denies any nausea vomiting, vomiting of blood, or coffee ground material in the vomitus. Patient denies any stomach pain. Patient states has had normal bowel movements no significant constipation or diarrhea. Patient denies any dysuria, hematuria or significant nocturia. Patient denies any problems walking, swelling in the joints or loss of balance. Patient denies any skin changes, loss of hair or loss of weight. Patient denies any excessive worrying or anxiety or significant depression. Patient denies any problems with insomnia. Patient denies excessive thirst, polyuria, polydipsia. Patient denies any swollen glands, patient denies easy bruising or easy bleeding. Patient denies any recent infections, allergies or URI. Patient s visual fields have not changed significantly in recent time.   PHYSICAL EXAM: Resp 16   Wt 189 lb (85.7 kg)   LMP 09/16/2023 (Approximate) Comment: 11/18/23 negative urine  pregnancy test  BMI 28.74 kg/m  She status post wide local excision and sentinel biopsy of the right breast both incisions are healed well.  No dominant masses noted in either breast.  No axillary or supraclavicular adenopathy is identified.  Well-developed well-nourished patient in NAD. HEENT reveals PERLA, EOMI, discs not visualized.  Oral cavity is clear. No oral mucosal lesions are identified. Neck is clear without evidence of cervical or supraclavicular adenopathy. Lungs are clear to A&P. Cardiac examination is essentially unremarkable with regular rate and rhythm without murmur rub or thrill. Abdomen is benign with no organomegaly or masses noted. Motor sensory and DTR levels are equal and symmetric in the upper and lower extremities. Cranial nerves II through XII are grossly intact. Proprioception is intact. No peripheral adenopathy or edema is identified. No motor or sensory levels are noted. Crude visual fields are within normal range.  LABORATORY DATA: Pathology reports reviewed    RADIOLOGY RESULTS: Mammograms ultrasound reviewed compatible with above-stated findings   IMPRESSION: Stage IIa invasive mammary carcinoma the right breast status post wide local excision and sentinel biopsy ER/PR positive and 53 year old female with low Oncotype DX score  PLAN: At this time of offered hypofractionated course of whole breast radiation over approximately 4 weeks.  Would also  boost her scar another 1600 centigrade based on the close DCIS margin.  Risks and benefits of treatment including skin reaction fatigue alteration of blood counts possible inclusion of superficial lung all were described in detail to the patient.  She comprehends my treatment plan well.  I personally set up and ordered CT simulation for early next week.  She also will benefit from endocrine therapy after completion of radiation.  I would like to take this opportunity to thank you for allowing me to participate in the care of  your patient.SABRA Marcey Penton, MD

## 2023-12-10 NOTE — Telephone Encounter (Signed)
 Spoke to patient regarding results of the Oncotype low risk.  Would not recommend adjuvant chemotherapy.   Proceed with radiation as planned.  Follow-up as planned

## 2023-12-14 ENCOUNTER — Ambulatory Visit
Admission: RE | Admit: 2023-12-14 | Discharge: 2023-12-14 | Disposition: A | Discharge status: Home / Self Care | Source: Ambulatory Visit | Attending: Radiation Oncology | Admitting: Radiation Oncology

## 2023-12-14 DIAGNOSIS — Z510 Encounter for antineoplastic radiation therapy: Secondary | ICD-10-CM | POA: Diagnosis not present

## 2023-12-18 ENCOUNTER — Other Ambulatory Visit: Payer: Self-pay | Admitting: *Deleted

## 2023-12-18 DIAGNOSIS — Z170 Estrogen receptor positive status [ER+]: Secondary | ICD-10-CM

## 2023-12-21 DIAGNOSIS — Z510 Encounter for antineoplastic radiation therapy: Secondary | ICD-10-CM | POA: Diagnosis not present

## 2023-12-22 ENCOUNTER — Encounter: Payer: Self-pay | Admitting: *Deleted

## 2023-12-22 ENCOUNTER — Encounter: Payer: Self-pay | Admitting: Licensed Clinical Social Worker

## 2023-12-22 ENCOUNTER — Ambulatory Visit

## 2023-12-22 ENCOUNTER — Ambulatory Visit: Payer: Self-pay | Admitting: Licensed Clinical Social Worker

## 2023-12-22 ENCOUNTER — Telehealth: Payer: Self-pay | Admitting: Licensed Clinical Social Worker

## 2023-12-22 DIAGNOSIS — Z1379 Encounter for other screening for genetic and chromosomal anomalies: Secondary | ICD-10-CM

## 2023-12-22 NOTE — Progress Notes (Signed)
 HPI:   Ms. Lehmkuhl was previously seen in the [facility] due to a personal and family history of cancer and concerns regarding a hereditary predisposition to cancer. Please refer to our prior cancer genetics clinic note for more information regarding our discussion, assessment and recommendations, at the time. Ms. Klasen recent genetic test results were disclosed to her, as were recommendations warranted by these results. These results and recommendations are discussed in more detail below.  CANCER HISTORY:  Oncology History Overview Note  IMPRESSION: 1. In the RIGHT breast, there is a subtle area of architectural distortion with a favored 7 mm sonographic correlate at 1 o'clock. Recommend ultrasound-guided biopsy for definitive characterization. Recommend attention on post marker placement mammogram to assess for mammographic/sonographic correlation. 2. There is a 5 mm group of indeterminate calcifications in the LEFT breast. Recommend stereotactic guided biopsy for definitive characterization. 3. Unchanged probably benign LEFT 4 mm breast mass at 3 o'clock 7 cm from the nipple. Option for continued follow-up versus definitive characterization with biopsy was discussed with patient. Patient would prefer to proceed with definitive characterization at this point in time. As such, recommend ultrasound-guided biopsy for definitive characterization. 4. 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 Interval resolution of the second adjacent mass, consistent with a benign etiology. 5. Additional benign LEFT breast cyst is noted at 3 o'clock 7 cm from the nipple. 6. No suspicious RIGHT axillary adenopathy. 7. Given history of atypical lobular hyperplasia, recommend consideration of supplemental screening with breast MRI with and without contrast.   RECOMMENDATION: 1. RIGHT breast ultrasound-guided biopsy x1 2. LEFT breast ultrasound-guided biopsy  x1 3. LEFT breast stereotactic guided biopsy x1   I have discussed the findings and recommendations with the patient. The biopsy procedure was discussed with the patient and questions were answered. Patient expressed their understanding of the biopsy recommendation. Patient will be scheduled for biopsy at her earliest convenience by the schedulers. Ordering provider will be notified. If applicable, a reminder letter will be sent to the patient regarding the next appointment.   BI-RADS CATEGORY  4: Suspicious.   Electronically Signed: By: Corean Salter M.D. On: 10/08/2023 16:27  1. 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       - TUBULE FORMATION: SCORE 1       - NUCLEAR PLEOMORPHISM: SCORE 2       - MITOTIC COUNT: SCORE 1       - TOTAL SCORE: 4       - OVERALL GRADE: 1       - LYMPHOVASCULAR INVASION: NOT IDENTIFIED       - CANCER LENGTH: 7 MM       - CALCIFICATIONS: PRESENT       - DUCTAL CARCINOMA IN SITU: PRESENT, INTERMEDIATE GRADE       - SEE NOTE.   ER-100; PR-95%; her 2 NEG   Carcinoma of upper-inner quadrant of right breast in female, estrogen receptor positive (HCC)  10/27/2023 Initial Diagnosis   Carcinoma of upper-inner quadrant of right breast in female, estrogen receptor positive (HCC)   10/27/2023 Cancer Staging   Staging form: Breast, AJCC 8th Edition - Pathologic: Stage IA (pT2, pN0, cM0, G1, ER+, PR+, HER2-) - Signed by Rennie Cindy SAUNDERS, MD on 12/08/2023 Histologic grading system: 3 grade system    Genetic Testing   No pathogenic variants identified on the [facility]  CancerNext-Expanded+RNA Panel. VUS in DDX41 called p.P35L identified. The report date is 12/19/2023.  The CancerNext-Expanded gene panel offered by [facility] and includes sequencing, rearrangement, and RNA analysis for the following 77 genes: AIP, ALK, APC, ATM, AXIN2, BAP1, BARD1, BMPR1A, BRCA1,  BRCA2, BRIP1, CDC73, CDH1, CDK4, CDKN1B, CDKN2A, CEBPA, CHEK2, CTNNA1, DDX41, DICER1, ETV6, FH, FLCN, GATA2, LZTR1, MAX, MBD4, MEN1, MET, MLH1, MSH2, MSH3, MSH6, MUTYH, NF1, NF2, NTHL1, PALB2, PHOX2B, PMS2, POT1, PRKAR1A, PTCH1, PTEN, RAD51C, RAD51D, RB1, RET, RPS20, RUNX1, SDHA, SDHAF2, SDHB, SDHC, SDHD, SMAD4, SMARCA4, SMARCB1, SMARCE1, STK11, SUFU, TMEM127, TP53, TSC1, TSC2, VHL, and WT1 (sequencing and deletion/duplication); EGFR, HOXB13, KIT, MITF, PDGFRA, POLD1, and POLE (sequencing only); EPCAM and GREM1 (deletion/duplication only).      FAMILY HISTORY:  We obtained a detailed, 4-generation family history.  Significant diagnoses are listed below: Family History  Problem Relation Age of Onset   Hypertension Mother    Thyroid disease Mother    Hypertension Father    Breast cancer Maternal Aunt        dx mid 29s   Cancer Maternal Grandfather        prostate vs GI vs other GU   Aneurysm Paternal Grandmother    Prostate cancer Paternal Grandfather        dx 59s-70s   Ms. Leite has 3 sons, 24, 21 and 50, and 1 daughter, 42. She has 1 brother, 52.    Ms. Aloi mother is living at 45. Patient has 1 maternal aunt, she had breast cancer in her mid 34s and has not had genetic testing. Patient's grandfather had cancer, either prostate or another GU/GI cancer, unsure.    Ms. Eveleth's father passed at 34. Patient's paternal grandfather had prostate in his 35s-70s and passed at 3.   Ms. Roads is unaware of previous family history of genetic testing for hereditary cancer risks. There is no reported Ashkenazi Jewish ancestry. There is no known consanguinity.      GENETIC TEST RESULTS:  The [facility] CancerNext-Expanded+RNA Panel found no pathogenic mutations.   The CancerNext-Expanded gene panel offered by [facility] and includes sequencing, rearrangement, and RNA analysis for the following 77 genes: AIP, ALK, APC, ATM, AXIN2, BAP1, BARD1, BMPR1A, BRCA1, BRCA2, BRIP1, CDC73, CDH1, CDK4,  CDKN1B, CDKN2A, CEBPA, CHEK2, CTNNA1, DDX41, DICER1, ETV6, FH, FLCN, GATA2, LZTR1, MAX, MBD4, MEN1, MET, MLH1, MSH2, MSH3, MSH6, MUTYH, NF1, NF2, NTHL1, PALB2, PHOX2B, PMS2, POT1, PRKAR1A, PTCH1, PTEN, RAD51C, RAD51D, RB1, RET, RPS20, RUNX1, SDHA, SDHAF2, SDHB, SDHC, SDHD, SMAD4, SMARCA4, SMARCB1, SMARCE1, STK11, SUFU, TMEM127, TP53, TSC1, TSC2, VHL, and WT1 (sequencing and deletion/duplication); EGFR, HOXB13, KIT, MITF, PDGFRA, POLD1, and POLE (sequencing only); EPCAM and GREM1 (deletion/duplication only).   The test report has been scanned into EPIC and is located under the Molecular Pathology section of the Results Review tab.  A portion of the result report is included below for reference. Genetic testing reported out on 12/19/2023.      Genetic testing identified a variant of uncertain significance (VUS) in the DDX41 gene.  At this time, it is unknown if this variant is associated with an increased risk for cancer or if it is benign, but most uncertain variants are reclassified to benign. It should not be used to make medical management decisions. With time, we suspect the laboratory will determine the significance of this variant, if any. If the laboratory reclassifies this variant, we will attempt to contact Ms. Hippe to discuss it further.   Even though a pathogenic variant was not  identified, possible explanations for the cancer in the family may include: There may be no hereditary risk for cancer in the family. The cancers in Ms. Ivens and/or her family may be sporadic/familial or due to other genetic and environmental factors. There may be a gene mutation in one of these genes that current testing methods cannot detect but that chance is small. There could be another gene that has not yet been discovered, or that we have not yet tested, that is responsible for the cancer diagnoses in the family.  It is also possible there is a hereditary cause for the cancer in the family that Ms. Loeber did  not inherit.  Therefore, it is important to remain in touch with cancer genetics in the future so that we can continue to offer Ms. Zieger the most up to date genetic testing.   ADDITIONAL GENETIC TESTING:  We discussed with Ms. Fleming that her genetic testing was fairly extensive.  If there are additional relevant genes identified to increase cancer risk that can be analyzed in the future, we would be happy to discuss and coordinate this testing at that time.    CANCER SCREENING RECOMMENDATIONS:  Ms. Celli's test result is considered negative (normal).  This means that we have not identified a hereditary cause for her personal and family history of cancer at this time.   An individual's cancer risk and medical management are not determined by genetic test results alone. Overall cancer risk assessment incorporates additional factors, including personal medical history, family history, and any available genetic information that may result in a personalized plan for cancer prevention and surveillance. Therefore, it is recommended she continue to follow the cancer management and screening guidelines provided by her oncology and primary healthcare provider.  RECOMMENDATIONS FOR FAMILY MEMBERS:   Since she did not inherit a identifiable mutation in a cancer predisposition gene included on this panel, her children could not have inherited a known mutation from her in one of these genes. Individuals in this family might be at some increased risk of developing cancer, over the general population risk, due to the family history of cancer.  Individuals in the family should notify their providers of the family history of cancer. We recommend women in this family have a yearly mammogram beginning at age 58, or 52 years younger than the earliest onset of cancer, an annual clinical breast exam, and perform monthly breast self-exams.  Family members should have colonoscopies by at age 75, or earlier, as recommended by  their providers. We do not recommend familial testing for the DDX41 variant of uncertain significance (VUS).  FOLLOW-UP:  Lastly, we discussed with Ms. Streett that cancer genetics is a rapidly advancing field and it is possible that new genetic tests will be appropriate for her and/or her family members in the future. We encouraged her to remain in contact with cancer genetics on an annual basis so we can update her personal and family histories and let her know of advances in cancer genetics that may benefit this family.   Our contact number was provided. Ms. Mccarver's questions were answered to her satisfaction, and she knows she is welcome to call us  at anytime with additional questions or concerns.    Dena Cary, MS, [facility] Genetic Counselor [email or web address] Phone: [phone]

## 2023-12-22 NOTE — Telephone Encounter (Signed)
 I contacted Ms. Erkkila to discuss her genetic testing results. No pathogenic variants were identified in the 77 genes analyzed. Detailed clinic note to follow.   The test report has been scanned into EPIC and is located under the Molecular Pathology section of the Results Review tab.  A portion of the result report is included below for reference.      Dena Cary, MS, [facility] Genetic Counselor [email or web address] Phone: [phone]

## 2023-12-23 ENCOUNTER — Ambulatory Visit

## 2023-12-23 ENCOUNTER — Ambulatory Visit
Admission: RE | Admit: 2023-12-23 | Discharge: 2023-12-23 | Discharge status: Home / Self Care | Source: Ambulatory Visit | Attending: Radiation Oncology | Admitting: Radiation Oncology

## 2023-12-23 DIAGNOSIS — Z510 Encounter for antineoplastic radiation therapy: Secondary | ICD-10-CM | POA: Diagnosis not present

## 2023-12-24 ENCOUNTER — Ambulatory Visit
Admission: RE | Admit: 2023-12-24 | Discharge: 2023-12-24 | Disposition: A | Discharge status: Home / Self Care | Source: Ambulatory Visit | Attending: Radiation Oncology | Admitting: Radiation Oncology

## 2023-12-24 ENCOUNTER — Other Ambulatory Visit: Payer: Self-pay

## 2023-12-24 DIAGNOSIS — Z510 Encounter for antineoplastic radiation therapy: Secondary | ICD-10-CM | POA: Diagnosis not present

## 2023-12-24 LAB — RAD ONC ARIA SESSION SUMMARY
Course Elapsed Days: 0
Plan Fractions Treated to Date: 1
Plan Prescribed Dose Per Fraction: 2.66 Gy
Plan Total Fractions Prescribed: 16
Plan Total Prescribed Dose: 42.56 Gy
Reference Point Dosage Given to Date: 2.66 Gy
Reference Point Session Dosage Given: 2.66 Gy
Session Number: 1

## 2023-12-25 ENCOUNTER — Other Ambulatory Visit: Payer: Self-pay

## 2023-12-25 ENCOUNTER — Ambulatory Visit
Admission: RE | Admit: 2023-12-25 | Discharge: 2023-12-25 | Disposition: A | Discharge status: Home / Self Care | Source: Ambulatory Visit | Attending: Radiation Oncology | Admitting: Radiation Oncology

## 2023-12-25 DIAGNOSIS — Z510 Encounter for antineoplastic radiation therapy: Secondary | ICD-10-CM | POA: Diagnosis not present

## 2023-12-25 LAB — RAD ONC ARIA SESSION SUMMARY
Course Elapsed Days: 1
Plan Fractions Treated to Date: 2
Plan Prescribed Dose Per Fraction: 2.66 Gy
Plan Total Fractions Prescribed: 16
Plan Total Prescribed Dose: 42.56 Gy
Reference Point Dosage Given to Date: 5.32 Gy
Reference Point Session Dosage Given: 2.66 Gy
Session Number: 2

## 2023-12-28 ENCOUNTER — Ambulatory Visit
Admission: RE | Admit: 2023-12-28 | Discharge: 2023-12-28 | Disposition: A | Discharge status: Home / Self Care | Source: Ambulatory Visit | Attending: Radiation Oncology | Admitting: Radiation Oncology

## 2023-12-28 ENCOUNTER — Other Ambulatory Visit: Payer: Self-pay

## 2023-12-28 DIAGNOSIS — Z510 Encounter for antineoplastic radiation therapy: Secondary | ICD-10-CM | POA: Diagnosis not present

## 2023-12-28 LAB — RAD ONC ARIA SESSION SUMMARY
Course Elapsed Days: 4
Plan Fractions Treated to Date: 3
Plan Prescribed Dose Per Fraction: 2.66 Gy
Plan Total Fractions Prescribed: 16
Plan Total Prescribed Dose: 42.56 Gy
Reference Point Dosage Given to Date: 7.98 Gy
Reference Point Session Dosage Given: 2.66 Gy
Session Number: 3

## 2023-12-29 ENCOUNTER — Ambulatory Visit
Admission: RE | Admit: 2023-12-29 | Discharge: 2023-12-29 | Disposition: A | Discharge status: Home / Self Care | Source: Ambulatory Visit | Attending: Radiation Oncology | Admitting: Radiation Oncology

## 2023-12-29 ENCOUNTER — Other Ambulatory Visit: Payer: Self-pay

## 2023-12-29 DIAGNOSIS — Z510 Encounter for antineoplastic radiation therapy: Secondary | ICD-10-CM | POA: Diagnosis not present

## 2023-12-29 LAB — RAD ONC ARIA SESSION SUMMARY
Course Elapsed Days: 5
Plan Fractions Treated to Date: 4
Plan Prescribed Dose Per Fraction: 2.66 Gy
Plan Total Fractions Prescribed: 16
Plan Total Prescribed Dose: 42.56 Gy
Reference Point Dosage Given to Date: 10.64 Gy
Reference Point Session Dosage Given: 2.66 Gy
Session Number: 4

## 2023-12-30 ENCOUNTER — Inpatient Hospital Stay

## 2023-12-30 ENCOUNTER — Other Ambulatory Visit: Payer: Self-pay

## 2023-12-30 ENCOUNTER — Ambulatory Visit
Admission: RE | Admit: 2023-12-30 | Discharge: 2023-12-30 | Disposition: A | Discharge status: Home / Self Care | Source: Ambulatory Visit | Attending: Radiation Oncology | Admitting: Radiation Oncology

## 2023-12-30 DIAGNOSIS — C50211 Malignant neoplasm of upper-inner quadrant of right female breast: Secondary | ICD-10-CM

## 2023-12-30 DIAGNOSIS — Z510 Encounter for antineoplastic radiation therapy: Secondary | ICD-10-CM | POA: Diagnosis not present

## 2023-12-30 LAB — RAD ONC ARIA SESSION SUMMARY
Course Elapsed Days: 6
Plan Fractions Treated to Date: 5
Plan Prescribed Dose Per Fraction: 2.66 Gy
Plan Total Fractions Prescribed: 16
Plan Total Prescribed Dose: 42.56 Gy
Reference Point Dosage Given to Date: 13.3 Gy
Reference Point Session Dosage Given: 2.66 Gy
Session Number: 5

## 2023-12-30 LAB — CBC (CANCER CENTER ONLY)
HCT: 40.3 % (ref 36.0–46.0)
Hemoglobin: 13.2 g/dL (ref 12.0–15.0)
MCH: 28.5 pg (ref 26.0–34.0)
MCHC: 32.8 g/dL (ref 30.0–36.0)
MCV: 87 fL (ref 80.0–100.0)
Platelet Count: 299 K/uL (ref 150–400)
RBC: 4.63 MIL/uL (ref 3.87–5.11)
RDW: 13 % (ref 11.5–15.5)
WBC Count: 7.1 K/uL (ref 4.0–10.5)
nRBC: 0 % (ref 0.0–0.2)

## 2023-12-31 ENCOUNTER — Other Ambulatory Visit: Payer: Self-pay

## 2023-12-31 ENCOUNTER — Ambulatory Visit
Admission: RE | Admit: 2023-12-31 | Discharge: 2023-12-31 | Disposition: A | Discharge status: Home / Self Care | Source: Ambulatory Visit | Attending: Radiation Oncology | Admitting: Radiation Oncology

## 2023-12-31 DIAGNOSIS — Z510 Encounter for antineoplastic radiation therapy: Secondary | ICD-10-CM | POA: Diagnosis not present

## 2023-12-31 LAB — RAD ONC ARIA SESSION SUMMARY
Course Elapsed Days: 7
Plan Fractions Treated to Date: 6
Plan Prescribed Dose Per Fraction: 2.66 Gy
Plan Total Fractions Prescribed: 16
Plan Total Prescribed Dose: 42.56 Gy
Reference Point Dosage Given to Date: 15.96 Gy
Reference Point Session Dosage Given: 2.66 Gy
Session Number: 6

## 2024-01-01 ENCOUNTER — Other Ambulatory Visit: Payer: Self-pay

## 2024-01-01 ENCOUNTER — Ambulatory Visit
Admission: RE | Admit: 2024-01-01 | Discharge: 2024-01-01 | Disposition: A | Discharge status: Home / Self Care | Source: Ambulatory Visit | Attending: Radiation Oncology | Admitting: Radiation Oncology

## 2024-01-01 DIAGNOSIS — Z510 Encounter for antineoplastic radiation therapy: Secondary | ICD-10-CM | POA: Diagnosis not present

## 2024-01-01 LAB — RAD ONC ARIA SESSION SUMMARY
Course Elapsed Days: 8
Plan Fractions Treated to Date: 7
Plan Prescribed Dose Per Fraction: 2.66 Gy
Plan Total Fractions Prescribed: 16
Plan Total Prescribed Dose: 42.56 Gy
Reference Point Dosage Given to Date: 18.62 Gy
Reference Point Session Dosage Given: 2.66 Gy
Session Number: 7

## 2024-01-05 ENCOUNTER — Ambulatory Visit
Admission: RE | Admit: 2024-01-05 | Discharge: 2024-01-05 | Disposition: A | Discharge status: Home / Self Care | Source: Ambulatory Visit | Attending: Radiation Oncology | Admitting: Radiation Oncology

## 2024-01-05 ENCOUNTER — Other Ambulatory Visit: Payer: Self-pay

## 2024-01-05 DIAGNOSIS — Z79899 Other long term (current) drug therapy: Secondary | ICD-10-CM | POA: Insufficient documentation

## 2024-01-05 DIAGNOSIS — Z1732 Human epidermal growth factor receptor 2 negative status: Secondary | ICD-10-CM | POA: Insufficient documentation

## 2024-01-05 DIAGNOSIS — Z8349 Family history of other endocrine, nutritional and metabolic diseases: Secondary | ICD-10-CM | POA: Insufficient documentation

## 2024-01-05 DIAGNOSIS — Z170 Estrogen receptor positive status [ER+]: Secondary | ICD-10-CM | POA: Insufficient documentation

## 2024-01-05 DIAGNOSIS — Z8042 Family history of malignant neoplasm of prostate: Secondary | ICD-10-CM | POA: Insufficient documentation

## 2024-01-05 DIAGNOSIS — Z510 Encounter for antineoplastic radiation therapy: Secondary | ICD-10-CM | POA: Diagnosis present

## 2024-01-05 DIAGNOSIS — Z803 Family history of malignant neoplasm of breast: Secondary | ICD-10-CM | POA: Insufficient documentation

## 2024-01-05 DIAGNOSIS — Z1722 Progesterone receptor negative status: Secondary | ICD-10-CM | POA: Insufficient documentation

## 2024-01-05 DIAGNOSIS — C50211 Malignant neoplasm of upper-inner quadrant of right female breast: Secondary | ICD-10-CM | POA: Insufficient documentation

## 2024-01-05 DIAGNOSIS — C50311 Malignant neoplasm of lower-inner quadrant of right female breast: Secondary | ICD-10-CM | POA: Diagnosis present

## 2024-01-05 DIAGNOSIS — Z8249 Family history of ischemic heart disease and other diseases of the circulatory system: Secondary | ICD-10-CM | POA: Insufficient documentation

## 2024-01-05 LAB — RAD ONC ARIA SESSION SUMMARY
Course Elapsed Days: 12
Plan Fractions Treated to Date: 8
Plan Prescribed Dose Per Fraction: 2.66 Gy
Plan Total Fractions Prescribed: 16
Plan Total Prescribed Dose: 42.56 Gy
Reference Point Dosage Given to Date: 21.28 Gy
Reference Point Session Dosage Given: 2.66 Gy
Session Number: 8

## 2024-01-06 ENCOUNTER — Ambulatory Visit
Admission: RE | Admit: 2024-01-06 | Discharge: 2024-01-06 | Disposition: A | Discharge status: Home / Self Care | Source: Ambulatory Visit | Attending: Radiation Oncology | Admitting: Radiation Oncology

## 2024-01-06 ENCOUNTER — Other Ambulatory Visit: Payer: Self-pay

## 2024-01-06 DIAGNOSIS — Z510 Encounter for antineoplastic radiation therapy: Secondary | ICD-10-CM | POA: Diagnosis not present

## 2024-01-06 LAB — RAD ONC ARIA SESSION SUMMARY
Course Elapsed Days: 13
Plan Fractions Treated to Date: 9
Plan Prescribed Dose Per Fraction: 2.66 Gy
Plan Total Fractions Prescribed: 16
Plan Total Prescribed Dose: 42.56 Gy
Reference Point Dosage Given to Date: 23.94 Gy
Reference Point Session Dosage Given: 2.66 Gy
Session Number: 9

## 2024-01-07 ENCOUNTER — Other Ambulatory Visit: Payer: Self-pay

## 2024-01-07 ENCOUNTER — Ambulatory Visit
Admission: RE | Admit: 2024-01-07 | Discharge: 2024-01-07 | Disposition: A | Discharge status: Home / Self Care | Source: Ambulatory Visit | Attending: Radiation Oncology | Admitting: Radiation Oncology

## 2024-01-07 DIAGNOSIS — Z510 Encounter for antineoplastic radiation therapy: Secondary | ICD-10-CM | POA: Diagnosis not present

## 2024-01-07 LAB — RAD ONC ARIA SESSION SUMMARY
Course Elapsed Days: 14
Plan Fractions Treated to Date: 10
Plan Prescribed Dose Per Fraction: 2.66 Gy
Plan Total Fractions Prescribed: 16
Plan Total Prescribed Dose: 42.56 Gy
Reference Point Dosage Given to Date: 26.6 Gy
Reference Point Session Dosage Given: 2.66 Gy
Session Number: 10

## 2024-01-08 ENCOUNTER — Ambulatory Visit
Admission: RE | Admit: 2024-01-08 | Discharge: 2024-01-08 | Disposition: A | Discharge status: Home / Self Care | Source: Ambulatory Visit | Attending: Radiation Oncology | Admitting: Radiation Oncology

## 2024-01-08 ENCOUNTER — Other Ambulatory Visit: Payer: Self-pay

## 2024-01-08 DIAGNOSIS — Z510 Encounter for antineoplastic radiation therapy: Secondary | ICD-10-CM | POA: Diagnosis not present

## 2024-01-08 LAB — RAD ONC ARIA SESSION SUMMARY
Course Elapsed Days: 15
Plan Fractions Treated to Date: 11
Plan Prescribed Dose Per Fraction: 2.66 Gy
Plan Total Fractions Prescribed: 16
Plan Total Prescribed Dose: 42.56 Gy
Reference Point Dosage Given to Date: 29.26 Gy
Reference Point Session Dosage Given: 2.66 Gy
Session Number: 11

## 2024-01-11 ENCOUNTER — Other Ambulatory Visit: Payer: Self-pay

## 2024-01-11 ENCOUNTER — Ambulatory Visit
Admission: RE | Admit: 2024-01-11 | Discharge: 2024-01-11 | Disposition: A | Discharge status: Home / Self Care | Source: Ambulatory Visit | Attending: Radiation Oncology | Admitting: Radiation Oncology

## 2024-01-11 DIAGNOSIS — Z510 Encounter for antineoplastic radiation therapy: Secondary | ICD-10-CM | POA: Diagnosis not present

## 2024-01-11 LAB — RAD ONC ARIA SESSION SUMMARY
Course Elapsed Days: 18
Plan Fractions Treated to Date: 12
Plan Prescribed Dose Per Fraction: 2.66 Gy
Plan Total Fractions Prescribed: 16
Plan Total Prescribed Dose: 42.56 Gy
Reference Point Dosage Given to Date: 31.92 Gy
Reference Point Session Dosage Given: 2.66 Gy
Session Number: 12

## 2024-01-12 ENCOUNTER — Ambulatory Visit
Admission: RE | Admit: 2024-01-12 | Discharge: 2024-01-12 | Disposition: A | Discharge status: Home / Self Care | Source: Ambulatory Visit | Attending: Radiation Oncology | Admitting: Radiation Oncology

## 2024-01-12 ENCOUNTER — Other Ambulatory Visit: Payer: Self-pay

## 2024-01-12 DIAGNOSIS — Z510 Encounter for antineoplastic radiation therapy: Secondary | ICD-10-CM | POA: Diagnosis not present

## 2024-01-12 LAB — RAD ONC ARIA SESSION SUMMARY
Course Elapsed Days: 19
Plan Fractions Treated to Date: 13
Plan Prescribed Dose Per Fraction: 2.66 Gy
Plan Total Fractions Prescribed: 16
Plan Total Prescribed Dose: 42.56 Gy
Reference Point Dosage Given to Date: 34.58 Gy
Reference Point Session Dosage Given: 2.66 Gy
Session Number: 13

## 2024-01-13 ENCOUNTER — Inpatient Hospital Stay: Attending: Internal Medicine

## 2024-01-13 ENCOUNTER — Other Ambulatory Visit: Payer: Self-pay

## 2024-01-13 ENCOUNTER — Ambulatory Visit
Admission: RE | Admit: 2024-01-13 | Discharge: 2024-01-13 | Disposition: A | Discharge status: Home / Self Care | Source: Ambulatory Visit | Attending: Radiation Oncology | Admitting: Radiation Oncology

## 2024-01-13 DIAGNOSIS — Z510 Encounter for antineoplastic radiation therapy: Secondary | ICD-10-CM | POA: Diagnosis not present

## 2024-01-13 DIAGNOSIS — Z170 Estrogen receptor positive status [ER+]: Secondary | ICD-10-CM

## 2024-01-13 DIAGNOSIS — C50311 Malignant neoplasm of lower-inner quadrant of right female breast: Secondary | ICD-10-CM | POA: Insufficient documentation

## 2024-01-13 LAB — CBC (CANCER CENTER ONLY)
HCT: 40 % (ref 36.0–46.0)
Hemoglobin: 13 g/dL (ref 12.0–15.0)
MCH: 28.2 pg (ref 26.0–34.0)
MCHC: 32.5 g/dL (ref 30.0–36.0)
MCV: 86.8 fL (ref 80.0–100.0)
Platelet Count: 291 K/uL (ref 150–400)
RBC: 4.61 MIL/uL (ref 3.87–5.11)
RDW: 13.2 % (ref 11.5–15.5)
WBC Count: 7.2 K/uL (ref 4.0–10.5)
nRBC: 0 % (ref 0.0–0.2)

## 2024-01-13 LAB — RAD ONC ARIA SESSION SUMMARY
Course Elapsed Days: 20
Plan Fractions Treated to Date: 14
Plan Prescribed Dose Per Fraction: 2.66 Gy
Plan Total Fractions Prescribed: 16
Plan Total Prescribed Dose: 42.56 Gy
Reference Point Dosage Given to Date: 37.24 Gy
Reference Point Session Dosage Given: 2.66 Gy
Session Number: 14

## 2024-01-14 ENCOUNTER — Ambulatory Visit

## 2024-01-15 ENCOUNTER — Ambulatory Visit
Admission: RE | Admit: 2024-01-15 | Discharge: 2024-01-15 | Disposition: A | Discharge status: Home / Self Care | Source: Ambulatory Visit | Attending: Radiation Oncology | Admitting: Radiation Oncology

## 2024-01-15 ENCOUNTER — Other Ambulatory Visit: Payer: Self-pay

## 2024-01-15 ENCOUNTER — Ambulatory Visit: Admission: RE | Admit: 2024-01-15 | Source: Ambulatory Visit

## 2024-01-15 DIAGNOSIS — Z510 Encounter for antineoplastic radiation therapy: Secondary | ICD-10-CM | POA: Diagnosis not present

## 2024-01-15 LAB — RAD ONC ARIA SESSION SUMMARY
Course Elapsed Days: 22
Plan Fractions Treated to Date: 15
Plan Prescribed Dose Per Fraction: 2.66 Gy
Plan Total Fractions Prescribed: 16
Plan Total Prescribed Dose: 42.56 Gy
Reference Point Dosage Given to Date: 39.9 Gy
Reference Point Session Dosage Given: 2.66 Gy
Session Number: 15

## 2024-01-18 ENCOUNTER — Other Ambulatory Visit: Payer: Self-pay

## 2024-01-18 ENCOUNTER — Ambulatory Visit
Admission: RE | Admit: 2024-01-18 | Discharge: 2024-01-18 | Disposition: A | Discharge status: Home / Self Care | Source: Ambulatory Visit | Attending: Radiation Oncology | Admitting: Radiation Oncology

## 2024-01-18 ENCOUNTER — Ambulatory Visit

## 2024-01-18 DIAGNOSIS — Z510 Encounter for antineoplastic radiation therapy: Secondary | ICD-10-CM | POA: Diagnosis not present

## 2024-01-18 LAB — RAD ONC ARIA SESSION SUMMARY
Course Elapsed Days: 25
Plan Fractions Treated to Date: 16
Plan Prescribed Dose Per Fraction: 2.66 Gy
Plan Total Fractions Prescribed: 16
Plan Total Prescribed Dose: 42.56 Gy
Reference Point Dosage Given to Date: 42.56 Gy
Reference Point Session Dosage Given: 2.66 Gy
Session Number: 16

## 2024-01-19 ENCOUNTER — Ambulatory Visit

## 2024-01-19 ENCOUNTER — Other Ambulatory Visit: Payer: Self-pay

## 2024-01-19 DIAGNOSIS — Z510 Encounter for antineoplastic radiation therapy: Secondary | ICD-10-CM | POA: Diagnosis not present

## 2024-01-19 LAB — RAD ONC ARIA SESSION SUMMARY
Course Elapsed Days: 26
Plan Fractions Treated to Date: 1
Plan Prescribed Dose Per Fraction: 2 Gy
Plan Total Fractions Prescribed: 8
Plan Total Prescribed Dose: 16 Gy
Reference Point Dosage Given to Date: 2 Gy
Reference Point Session Dosage Given: 2 Gy
Session Number: 17

## 2024-01-20 ENCOUNTER — Other Ambulatory Visit: Payer: Self-pay

## 2024-01-20 ENCOUNTER — Ambulatory Visit
Admission: RE | Admit: 2024-01-20 | Discharge: 2024-01-20 | Disposition: A | Discharge status: Home / Self Care | Source: Ambulatory Visit | Attending: Radiation Oncology | Admitting: Radiation Oncology

## 2024-01-20 DIAGNOSIS — Z510 Encounter for antineoplastic radiation therapy: Secondary | ICD-10-CM | POA: Diagnosis not present

## 2024-01-20 LAB — RAD ONC ARIA SESSION SUMMARY
Course Elapsed Days: 27
Plan Fractions Treated to Date: 2
Plan Prescribed Dose Per Fraction: 2 Gy
Plan Total Fractions Prescribed: 8
Plan Total Prescribed Dose: 16 Gy
Reference Point Dosage Given to Date: 4 Gy
Reference Point Session Dosage Given: 2 Gy
Session Number: 18

## 2024-01-21 ENCOUNTER — Other Ambulatory Visit: Payer: Self-pay

## 2024-01-21 ENCOUNTER — Ambulatory Visit
Admission: RE | Admit: 2024-01-21 | Discharge: 2024-01-21 | Disposition: A | Discharge status: Home / Self Care | Source: Ambulatory Visit | Attending: Radiation Oncology | Admitting: Radiation Oncology

## 2024-01-21 DIAGNOSIS — Z510 Encounter for antineoplastic radiation therapy: Secondary | ICD-10-CM | POA: Diagnosis not present

## 2024-01-21 LAB — RAD ONC ARIA SESSION SUMMARY
Course Elapsed Days: 28
Plan Fractions Treated to Date: 3
Plan Prescribed Dose Per Fraction: 2 Gy
Plan Total Fractions Prescribed: 8
Plan Total Prescribed Dose: 16 Gy
Reference Point Dosage Given to Date: 6 Gy
Reference Point Session Dosage Given: 2 Gy
Session Number: 19

## 2024-01-22 ENCOUNTER — Other Ambulatory Visit: Payer: Self-pay

## 2024-01-22 ENCOUNTER — Ambulatory Visit
Admission: RE | Admit: 2024-01-22 | Discharge: 2024-01-22 | Disposition: A | Discharge status: Home / Self Care | Source: Ambulatory Visit | Attending: Radiation Oncology | Admitting: Radiation Oncology

## 2024-01-22 DIAGNOSIS — Z510 Encounter for antineoplastic radiation therapy: Secondary | ICD-10-CM | POA: Diagnosis not present

## 2024-01-22 LAB — RAD ONC ARIA SESSION SUMMARY
Course Elapsed Days: 29
Plan Fractions Treated to Date: 4
Plan Prescribed Dose Per Fraction: 2 Gy
Plan Total Fractions Prescribed: 8
Plan Total Prescribed Dose: 16 Gy
Reference Point Dosage Given to Date: 8 Gy
Reference Point Session Dosage Given: 2 Gy
Session Number: 20

## 2024-01-25 ENCOUNTER — Other Ambulatory Visit: Payer: Self-pay

## 2024-01-25 ENCOUNTER — Ambulatory Visit
Admission: RE | Admit: 2024-01-25 | Discharge: 2024-01-25 | Disposition: A | Discharge status: Home / Self Care | Source: Ambulatory Visit | Attending: Radiation Oncology | Admitting: Radiation Oncology

## 2024-01-25 DIAGNOSIS — Z510 Encounter for antineoplastic radiation therapy: Secondary | ICD-10-CM | POA: Diagnosis not present

## 2024-01-25 LAB — RAD ONC ARIA SESSION SUMMARY
Course Elapsed Days: 32
Plan Fractions Treated to Date: 5
Plan Prescribed Dose Per Fraction: 2 Gy
Plan Total Fractions Prescribed: 8
Plan Total Prescribed Dose: 16 Gy
Reference Point Dosage Given to Date: 10 Gy
Reference Point Session Dosage Given: 2 Gy
Session Number: 21

## 2024-01-26 ENCOUNTER — Ambulatory Visit
Admission: RE | Admit: 2024-01-26 | Discharge: 2024-01-26 | Disposition: A | Discharge status: Home / Self Care | Source: Ambulatory Visit | Attending: Radiation Oncology | Admitting: Radiation Oncology

## 2024-01-26 ENCOUNTER — Other Ambulatory Visit: Payer: Self-pay

## 2024-01-26 DIAGNOSIS — Z510 Encounter for antineoplastic radiation therapy: Secondary | ICD-10-CM | POA: Diagnosis not present

## 2024-01-26 LAB — RAD ONC ARIA SESSION SUMMARY
Course Elapsed Days: 33
Plan Fractions Treated to Date: 6
Plan Prescribed Dose Per Fraction: 2 Gy
Plan Total Fractions Prescribed: 8
Plan Total Prescribed Dose: 16 Gy
Reference Point Dosage Given to Date: 12 Gy
Reference Point Session Dosage Given: 2 Gy
Session Number: 22

## 2024-01-27 ENCOUNTER — Ambulatory Visit

## 2024-01-27 ENCOUNTER — Other Ambulatory Visit: Payer: Self-pay

## 2024-01-27 ENCOUNTER — Ambulatory Visit
Admission: RE | Admit: 2024-01-27 | Discharge: 2024-01-27 | Disposition: A | Discharge status: Home / Self Care | Source: Ambulatory Visit | Attending: Radiation Oncology | Admitting: Radiation Oncology

## 2024-01-27 DIAGNOSIS — Z510 Encounter for antineoplastic radiation therapy: Secondary | ICD-10-CM | POA: Diagnosis not present

## 2024-01-27 LAB — RAD ONC ARIA SESSION SUMMARY
Course Elapsed Days: 34
Plan Fractions Treated to Date: 7
Plan Prescribed Dose Per Fraction: 2 Gy
Plan Total Fractions Prescribed: 8
Plan Total Prescribed Dose: 16 Gy
Reference Point Dosage Given to Date: 14 Gy
Reference Point Session Dosage Given: 2 Gy
Session Number: 23

## 2024-01-28 ENCOUNTER — Encounter: Payer: Self-pay | Admitting: *Deleted

## 2024-01-28 ENCOUNTER — Ambulatory Visit
Admission: RE | Admit: 2024-01-28 | Discharge: 2024-01-28 | Disposition: A | Discharge status: Home / Self Care | Source: Ambulatory Visit | Attending: Radiation Oncology | Admitting: Radiation Oncology

## 2024-01-28 ENCOUNTER — Other Ambulatory Visit: Payer: Self-pay

## 2024-01-28 DIAGNOSIS — Z510 Encounter for antineoplastic radiation therapy: Secondary | ICD-10-CM | POA: Diagnosis not present

## 2024-01-28 LAB — RAD ONC ARIA SESSION SUMMARY
Course Elapsed Days: 35
Plan Fractions Treated to Date: 8
Plan Prescribed Dose Per Fraction: 2 Gy
Plan Total Fractions Prescribed: 8
Plan Total Prescribed Dose: 16 Gy
Reference Point Dosage Given to Date: 16 Gy
Reference Point Session Dosage Given: 2 Gy
Session Number: 24

## 2024-01-29 NOTE — Radiation Completion Notes (Signed)
 Patient Name: Yolanda Gill, Yolanda Gill MRN: [id number] Date of Birth: March 26, 1971 Referring Physician: LAYMAN PIETY, M.D. Date of Service: 2024-01-29 Radiation Oncologist: Marcey Penton, M.D. [facility] -                              RADIATION ONCOLOGY END OF TREATMENT NOTE     Diagnosis: C50.211 Malignant neoplasm of upper-inner quadrant of right female breast Staging on 2023-10-27: Carcinoma of upper-inner quadrant of right breast in female, estrogen receptor positive (HCC) T=pT2, N=pN0, M=cM0 Intent: Curative     HPI: Patient is a 53 year old female who presented with an abnormal mammogram of the right breast.  There was a subtle area of architectural distortion at 1:00.  There was also a 5 mm group of indeterminate calcification in the left breast.  Core biopsy of the right breast showed invasive mammary carcinoma no special type overall grade 1.  Ductal carcinoma was present.  Left breast showed columnar cell change and pseudoangiomatous stromal hyperplasia no evidence of malignancy.  She went on to have a wide local excision for a 2.4 cm grade 1 invasive ductal carcinoma.  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  Margins were clear at 2 mm for the invasive component and less than 1 mm from the DCIS component.  Tumor again was strongly ER/PR positive HER2/neu not overexpressed.  5 sentinel lymph nodes were sampled all negative for malignancy.  She has done well postoperatively.  Her Oncotype DX came back low risk for recurrence that she will not have systemic chemotherapy.  She specifically denies breast tenderness cough or bone pain.  She is seen today for radiation oncology opinion.      ==========DELIVERED PLANS==========  First Treatment Date: 2023-12-24 Last Treatment Date: 2024-01-28   Plan Name: Breast_R Site: Breast, Right Technique: 3D Mode: Photon Dose Per Fraction: 2.66 Gy Prescribed Dose (Delivered / Prescribed): 42.56 Gy / 42.56 Gy Prescribed Fxs (Delivered /  Prescribed): 16 / 16   Plan Name: Breast_R_Bst Site: Breast, Right Technique: 3D Mode: Photon Dose Per Fraction: 2 Gy Prescribed Dose (Delivered / Prescribed): 16 Gy / 16 Gy Prescribed Fxs (Delivered / Prescribed): 8 / 8     ==========ON TREATMENT VISIT DATES========== 2023-12-29, 2024-01-05, 2024-01-12, 2024-01-19, 2024-01-26     ==========UPCOMING VISITS========== 03/07/2024 [facility] RAD ONCOLOGY FOLLOW UP 30 Penton Marcey, MD  02/08/2024 [facility] MED ONC SOZO SCREEN Georgina Shasta POUR, RN  02/08/2024 [facility] MED ONC EST PT Rennie Cindy SAUNDERS, MD        ==========APPENDIX - ON TREATMENT VISIT NOTES==========   See weekly On Treatment Notes in Epic for details in the Media tab (listed as Progress notes on the On Treatment Visit Dates listed above).

## 2024-02-08 ENCOUNTER — Encounter: Payer: Self-pay | Admitting: Internal Medicine

## 2024-02-08 ENCOUNTER — Telehealth: Payer: Self-pay | Admitting: Pharmacist

## 2024-02-08 ENCOUNTER — Inpatient Hospital Stay: Admitting: *Deleted

## 2024-02-08 ENCOUNTER — Encounter: Payer: Self-pay | Admitting: *Deleted

## 2024-02-08 ENCOUNTER — Inpatient Hospital Stay: Attending: Internal Medicine | Admitting: Internal Medicine

## 2024-02-08 VITALS — BP 124/65 | HR 77 | Temp 98.1°F | Resp 16 | Ht 68.0 in | Wt 189.7 lb

## 2024-02-08 DIAGNOSIS — Z9189 Other specified personal risk factors, not elsewhere classified: Secondary | ICD-10-CM

## 2024-02-08 DIAGNOSIS — I10 Essential (primary) hypertension: Secondary | ICD-10-CM | POA: Diagnosis not present

## 2024-02-08 DIAGNOSIS — Z170 Estrogen receptor positive status [ER+]: Secondary | ICD-10-CM | POA: Insufficient documentation

## 2024-02-08 DIAGNOSIS — Z1732 Human epidermal growth factor receptor 2 negative status: Secondary | ICD-10-CM | POA: Insufficient documentation

## 2024-02-08 DIAGNOSIS — C50211 Malignant neoplasm of upper-inner quadrant of right female breast: Secondary | ICD-10-CM | POA: Diagnosis not present

## 2024-02-08 DIAGNOSIS — E039 Hypothyroidism, unspecified: Secondary | ICD-10-CM | POA: Insufficient documentation

## 2024-02-08 DIAGNOSIS — Z79810 Long term (current) use of selective estrogen receptor modulators (SERMs): Secondary | ICD-10-CM | POA: Diagnosis not present

## 2024-02-08 DIAGNOSIS — Z79899 Other long term (current) drug therapy: Secondary | ICD-10-CM | POA: Diagnosis not present

## 2024-02-08 DIAGNOSIS — Z803 Family history of malignant neoplasm of breast: Secondary | ICD-10-CM | POA: Insufficient documentation

## 2024-02-08 DIAGNOSIS — Z1721 Progesterone receptor positive status: Secondary | ICD-10-CM | POA: Diagnosis not present

## 2024-02-08 MED ORDER — TAMOXIFEN CITRATE 20 MG PO TABS: 20.0000 mg | ORAL_TABLET | Freq: Every day | ORAL | 4 refills | Status: DC

## 2024-02-08 NOTE — Telephone Encounter (Signed)
 Patient mentioned to MD she was interested in taking a cortisol gummies to help with hot flashes. Asked by MD to review for issues with Tamoxifen. Below is the specific gummy she was looking at ordering.    There are several interactions with the ingredients on this gummy and her tamoxifen (see below list). Recommended she not take these cortisol gummies. Patient stated her understanding and agreed NOT to take the gummies.

## 2024-02-08 NOTE — Progress Notes (Signed)
 No concerns today

## 2024-02-08 NOTE — Assessment & Plan Note (Addendum)
 JULY 2025- # Breast, right, stage I [pT2 pN0; M0- G-1 ER-100; PR-95%; her 2 NEG Her 2 NEG.- INVASIVE MAMMARY CARCINOMA, NO SPECIAL TYPE (DUCTAL); grade 1-status postlumpectomy Dr. Lane.     # LEFT BREAST -atypical lobular hyperplasia.  Oncotype- RS- 6- < 3% risk of recurrence.   # Discussed the role of anti-hormonal therapy mechanism of action; since patient is peri-menopausal recommend tamoxifen.  I would recommend tamoxifen for 5 years.  Long discussion regarding the potential adverse events on tamoxifen including but not limited to hot flashes, mood swings, thromboembolic events strokes and also small risk of uterine cancers.    # Bone health- ca+ vit D OTC.   # Genetics counseling:NEGATIVE GENETICS.   # DISPOSITION: # follow up in 3 months- MD; no labs- - Dr.B  Addendum: Discussed with pharmacy regarding potential interaction with tamoxifen and cortisol Gummies-St. John's wort-decreases the tamoxifen levels/to be discussed with patient by pharmacy.

## 2024-02-08 NOTE — Progress Notes (Signed)
 SUBJECTIVE: Pt returns for her 3 month L-Dex screen.    PAIN:  Are you having pain? No   SOZO SCREENING: Patient was assessed today using the SOZO machine to determine the lymphedema index score. This was compared to her baseline score. It was determined that she is within the recommended range when compared to her baseline and no further action is needed at this time. She will continue SOZO screenings. These are done every 3 months for 2 years post operatively followed by every 6 months for 2 years, and then annually.     L-DEX FLOWSHEETS                L-DEX LYMPHEDEMA SCREENING    Measurement Type Unilateral     L-DEX MEASUREMENT EXTREMITY Upper Extremity     POSITION  Standing     DOMINANT SIDE Right     At Risk Side Right    BASELINE SCORE (UNILATERAL) 1.8    L-DEX SCORE (UNILATERAL) 4.2    VALUE CHANGE (UNILAT) 2.4

## 2024-02-08 NOTE — Progress Notes (Signed)
 [facility] CONSULT NOTE  Patient Care Team: Lenon Layman ORN, MD as PCP - General (Internal Medicine) Schermerhorn, Debby PARAS, MD as Referring Physician (Obstetrics and Gynecology) Dessa, Reyes ORN, MD (General Surgery) Georgina Shasta POUR, RN as Oncology Nurse Navigator Rennie Cindy SAUNDERS, MD as Consulting Physician (Oncology) Lenn Aran, MD as Consulting Physician (Radiation Oncology) Lenn Aran, MD as Consulting Physician (Radiation Oncology)  CHIEF COMPLAINTS/PURPOSE OF CONSULTATION: Breast cancer  #  Oncology History Overview Note  IMPRESSION: 1. In the RIGHT breast, there is a subtle area of architectural distortion with a favored 7 mm sonographic correlate at 1 o'clock. Recommend ultrasound-guided biopsy for definitive characterization. Recommend attention on post marker placement mammogram to assess for mammographic/sonographic correlation. 2. There is a 5 mm group of indeterminate calcifications in the LEFT breast. Recommend stereotactic guided biopsy for definitive characterization. 3. Unchanged probably benign LEFT 4 mm breast mass at 3 o'clock 7 cm from the nipple. Option for continued follow-up versus definitive characterization with biopsy was discussed with patient. Patient would prefer to proceed with definitive characterization at this point in time. As such, recommend ultrasound-guided biopsy for definitive characterization. 4. 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 Interval resolution of the second adjacent mass, consistent with a benign etiology. 5. Additional benign LEFT breast cyst is noted at 3 o'clock 7 cm from the nipple. 6. No suspicious RIGHT axillary adenopathy. 7. Given history of atypical lobular hyperplasia, recommend consideration of supplemental screening with breast MRI with and without contrast.   RECOMMENDATION: 1. RIGHT breast ultrasound-guided biopsy x1 2. LEFT breast ultrasound-guided  biopsy x1 3. LEFT breast stereotactic guided biopsy x1   I have discussed the findings and recommendations with the patient. The biopsy procedure was discussed with the patient and questions were answered. Patient expressed their understanding of the biopsy recommendation. Patient will be scheduled for biopsy at her earliest convenience by the schedulers. Ordering provider will be notified. If applicable, a reminder letter will be sent to the patient regarding the next appointment.   BI-RADS CATEGORY  4: Suspicious.   Electronically Signed: By: Corean Salter M.D. On: 10/08/2023 16:27  1. 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       - TUBULE FORMATION: SCORE 1       - NUCLEAR PLEOMORPHISM: SCORE 2       - MITOTIC COUNT: SCORE 1       - TOTAL SCORE: 4       - OVERALL GRADE: 1       - LYMPHOVASCULAR INVASION: NOT IDENTIFIED       - CANCER LENGTH: 7 MM       - CALCIFICATIONS: PRESENT       - DUCTAL CARCINOMA IN SITU: PRESENT, INTERMEDIATE GRADE       - SEE NOTE.   ER-100; PR-95%; her 2 NEG   Carcinoma of upper-inner quadrant of right breast in female, estrogen receptor positive (HCC)  10/27/2023 Initial Diagnosis   Carcinoma of upper-inner quadrant of right breast in female, estrogen receptor positive (HCC)   10/27/2023 Cancer Staging   Staging form: Breast, AJCC 8th Edition - Pathologic: Stage IA (pT2, pN0, cM0, G1, ER+, PR+, HER2-) - Signed by Rennie Cindy SAUNDERS, MD on 12/08/2023 Histologic grading system: 3 grade system    Genetic Testing   No pathogenic variants identified on the [facility] CancerNext-Expanded+RNA Panel. VUS in DDX41 called  p.P35L identified. The report date is 12/19/2023.  The CancerNext-Expanded gene panel offered by [facility] and includes sequencing, rearrangement, and RNA analysis for the following 77 genes: AIP, ALK, APC, ATM, AXIN2, BAP1, BARD1, BMPR1A,  BRCA1, BRCA2, BRIP1, CDC73, CDH1, CDK4, CDKN1B, CDKN2A, CEBPA, CHEK2, CTNNA1, DDX41, DICER1, ETV6, FH, FLCN, GATA2, LZTR1, MAX, MBD4, MEN1, MET, MLH1, MSH2, MSH3, MSH6, MUTYH, NF1, NF2, NTHL1, PALB2, PHOX2B, PMS2, POT1, PRKAR1A, PTCH1, PTEN, RAD51C, RAD51D, RB1, RET, RPS20, RUNX1, SDHA, SDHAF2, SDHB, SDHC, SDHD, SMAD4, SMARCA4, SMARCB1, SMARCE1, STK11, SUFU, TMEM127, TP53, TSC1, TSC2, VHL, and WT1 (sequencing and deletion/duplication); EGFR, HOXB13, KIT, MITF, PDGFRA, POLD1, and POLE (sequencing only); EPCAM and GREM1 (deletion/duplication only).      HISTORY OF PRESENTING ILLNESS: Patient ambulating-independently. Alone.   Yolanda Gill 53 y.o.  female perimenopausal with no prior history of stage I right-sided ER/PR positive HER2 negative breast cancer s/p  lumpectomy- is here for a follow up. .  Patient is healing well from surgery and radiation.   Denies any infection issues.  Review of Systems  Constitutional:  Negative for chills, diaphoresis, fever, malaise/fatigue and weight loss.  HENT:  Negative for nosebleeds and sore throat.   Eyes:  Negative for double vision.  Respiratory:  Negative for cough, hemoptysis, sputum production, shortness of breath and wheezing.   Cardiovascular:  Negative for chest pain, palpitations, orthopnea and leg swelling.  Gastrointestinal:  Negative for abdominal pain, blood in stool, constipation, diarrhea, heartburn, melena, nausea and vomiting.  Genitourinary:  Negative for dysuria, frequency and urgency.  Musculoskeletal:  Negative for back pain and joint pain.  Skin: Negative.  Negative for itching and rash.  Neurological:  Negative for dizziness, tingling, focal weakness, weakness and headaches.  Endo/Heme/Allergies:  Does not bruise/bleed easily.  Psychiatric/Behavioral:  Negative for depression. The patient is not nervous/anxious and does not have insomnia.      MEDICAL HISTORY:  Past Medical History:  Diagnosis Date   Cancer (HCC)    breast    Hypertension    Hypothyroidism    Thyroid disease     SURGICAL HISTORY: Past Surgical History:  Procedure Laterality Date   AXILLARY SENTINEL NODE BIOPSY Right 11/18/2023   Procedure: BIOPSY, LYMPH NODE, SENTINEL, AXILLARY;  Surgeon: Lane Shope, MD;  Location: [facility];  Service: General;  Laterality: Right;   BREAST BIOPSY Left 08/2022   Atypical lobular hyperplasia   BREAST BIOPSY Bilateral 10/19/2023   2 u/s bx lt (ribbon) rt ( ) one stereo (x) clip   BREAST BIOPSY Right 10/19/2023   US  RT BREAST BX W LOC DEV 1ST LESION IMG BX SPEC US  GUIDE 10/19/2023 [facility]   BREAST BIOPSY Left 10/19/2023   MM LT BREAST BX W LOC DEV 1ST LESION IMAGE BX SPEC STEREO GUIDE 10/19/2023 [facility]   BREAST BIOPSY Left 10/19/2023   US  LT BREAST BX W LOC DEV EA ADD LESION IMG BX SPEC US  GUIDE 10/19/2023 [facility]   BREAST BIOPSY Right 11/02/2023   MM RT BREAST SAVI/RF TAG 1ST LESION MAMMO GUIDE 11/02/2023 [facility]   BREAST LUMPECTOMY WITH RADIO FREQUENCY LOCALIZER Right 11/18/2023   Procedure: BREAST LUMPECTOMY WITH RADIO FREQUENCY LOCALIZER;  Surgeon: Lane Shope, MD;  Location: [facility];  Service: General;  Laterality: Right;   BREAST SURGERY Left 10/31/2013   Radial scar without atypia   COLONOSCOPY     FINE NEEDLE ASPIRATION Left    when she was in college - left breast   WISDOM TOOTH EXTRACTION  05/05/1988    SOCIAL HISTORY: Social  History   Socioeconomic History   Marital status: Divorced    Spouse name: Not on file   Number of children: Not on file   Years of education: Not on file   Highest education level: Not on file  Occupational History   Not on file  Tobacco Use   Smoking status: Never   Smokeless tobacco: Never  Vaping Use   Vaping status: Never Used  Substance and Sexual Activity   Alcohol use: No   Drug use: No   Sexual activity: Not on file  Other Topics Concern   Not on file  Social History Narrative   Lives at home with 55 yr  old son   Social Drivers of Corporate investment banker Strain: Low Risk  (09/25/2022)   Received from [facility]   Overall Financial Resource Strain (CARDIA)    Difficulty of Paying Living Expenses: Not hard at all  Food Insecurity: No Food Insecurity (10/27/2023)   Hunger Vital Sign    Worried About Running Out of Food in the Last Year: Never true    Ran Out of Food in the Last Year: Never true  Transportation Needs: No Transportation Needs (10/27/2023)   PRAPARE - Administrator, Civil Service (Medical): No    Lack of Transportation (Non-Medical): No  Physical Activity: Not on file  Stress: Not on file  Social Connections: Not on file  Intimate Partner Violence: Not At Risk (10/27/2023)   Humiliation, Afraid, Rape, and Kick questionnaire    Fear of Current or Ex-Partner: No    Emotionally Abused: No    Physically Abused: No    Sexually Abused: No    FAMILY HISTORY: Family History  Problem Relation Age of Onset   Hypertension Mother    Thyroid disease Mother    Hypertension Father    Breast cancer Maternal Aunt        dx mid 36s   Cancer Maternal Grandfather        prostate vs GI vs other GU   Aneurysm Paternal Grandmother    Prostate cancer Paternal Grandfather        dx 27s-70s    ALLERGIES:  has no known allergies.  MEDICATIONS:  Current Outpatient Medications  Medication Sig Dispense Refill   cetirizine (ZYRTEC) 10 MG tablet Take 10 mg by mouth in the morning.     Cyanocobalamin (HM SUPER VITAMIN B12 PO) Take 1 tablet by mouth 2 (two) times a week.     levothyroxine (SYNTHROID, LEVOTHROID) 75 MCG tablet Take 75 mcg by mouth daily before breakfast.     losartan-hydrochlorothiazide (HYZAAR) 100-12.5 MG tablet Take 1 tablet by mouth in the morning.     tamoxifen (NOLVADEX) 20 MG tablet Take 1 tablet (20 mg total) by mouth daily. 30 tablet 4   No current facility-administered medications for this visit.   PHYSICAL  EXAMINATION:   Vitals:   02/08/24 1453  BP: 124/65  Pulse: 77  Resp: 16  Temp: 98.1 F (36.7 C)  SpO2: 100%    Filed Weights   02/08/24 1453  Weight: 189 lb 11.2 oz (86 kg)     Physical Exam Vitals and nursing note reviewed.  HENT:     Head: Normocephalic and atraumatic.     Mouth/Throat:     Pharynx: Oropharynx is clear.  Eyes:     Extraocular Movements: Extraocular movements intact.     Pupils: Pupils are equal, round, and reactive to light.  Cardiovascular:  Rate and Rhythm: Normal rate and regular rhythm.  Pulmonary:     Comments: Decreased breath sounds bilaterally.  Abdominal:     Palpations: Abdomen is soft.  Musculoskeletal:        General: Normal range of motion.     Cervical back: Normal range of motion.  Skin:    General: Skin is warm.  Neurological:     General: No focal deficit present.     Mental Status: She is alert and oriented to person, place, and time.  Psychiatric:        Behavior: Behavior normal.        Judgment: Judgment normal.      LABORATORY DATA:  I have reviewed the data as listed Lab Results  Component Value Date   WBC 7.2 01/13/2024   HGB 13.0 01/13/2024   HCT 40.0 01/13/2024   MCV 86.8 01/13/2024   PLT 291 01/13/2024   Recent Labs    11/11/23 1357  NA 138  K 3.4*  CL 104  CO2 26  GLUCOSE 92  BUN 16  CREATININE 0.71  CALCIUM 9.4  GFRNONAA >60  PROT 7.5  ALBUMIN 3.8  AST 19  ALT 22  ALKPHOS 62  BILITOT 0.8    RADIOGRAPHIC STUDIES: I have personally reviewed the radiological images as listed and agreed with the findings in the report. No results found.   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 Lane.     # LEFT BREAST -atypical lobular  hyperplasia.  Oncotype- RS- 6- < 3% risk of recurrence.   # Discussed the role of anti-hormonal therapy mechanism of action; since patient is peri-menopausal recommend tamoxifen.  I would recommend tamoxifen for 5 years.  Long discussion regarding the potential adverse events on tamoxifen including but not limited to hot flashes, mood swings, thromboembolic events strokes and also small risk of uterine cancers.    # Bone health- ca+ vit D OTC.   # Genetics counseling:NEGATIVE GENETICS.   # DISPOSITION: # follow up in 3 months- MD; no labs- - Dr.B  Addendum: Discussed with pharmacy regarding potential interaction with tamoxifen and cortisol Gummies-St. John's wort-decreases the tamoxifen levels/to be discussed with patient by pharmacy.    All questions were answered. The patient/family knows to call the clinic with any problems, questions or concerns.   Cindy JONELLE Joe, MD 02/08/2024 3:57 PM

## 2024-03-07 ENCOUNTER — Ambulatory Visit: Admitting: Radiation Oncology

## 2024-03-16 ENCOUNTER — Ambulatory Visit
Admission: RE | Admit: 2024-03-16 | Discharge: 2024-03-16 | Disposition: A | Discharge status: Home / Self Care | Source: Ambulatory Visit | Attending: Radiation Oncology | Admitting: Radiation Oncology

## 2024-03-16 ENCOUNTER — Encounter: Payer: Self-pay | Admitting: Radiation Oncology

## 2024-03-16 VITALS — BP 115/57 | HR 69 | Temp 98.0°F | Resp 15 | Ht 68.0 in | Wt 190.0 lb

## 2024-03-16 DIAGNOSIS — C50211 Malignant neoplasm of upper-inner quadrant of right female breast: Secondary | ICD-10-CM | POA: Diagnosis present

## 2024-03-16 DIAGNOSIS — F39 Unspecified mood [affective] disorder: Secondary | ICD-10-CM | POA: Insufficient documentation

## 2024-03-16 DIAGNOSIS — Z79810 Long term (current) use of selective estrogen receptor modulators (SERMs): Secondary | ICD-10-CM | POA: Insufficient documentation

## 2024-03-16 DIAGNOSIS — Z923 Personal history of irradiation: Secondary | ICD-10-CM | POA: Insufficient documentation

## 2024-03-16 DIAGNOSIS — Z170 Estrogen receptor positive status [ER+]: Secondary | ICD-10-CM | POA: Diagnosis not present

## 2024-03-16 NOTE — Addendum Note (Signed)
 Encounter addended by: Lenn Aran, MD on: 03/16/2024 2:57 PM  Actions taken: Clinical Note Signed, Level of Service modified

## 2024-03-16 NOTE — Progress Notes (Signed)
 Radiation Oncology Follow up Note  Name: Yolanda Gill   Date:   03/16/2024 MRN:  [id number] DOB: 09-17-1970    This 53 y.o. female presents to the clinic today for 1 month follow-up status post whole breast radiation to her right breast for stage IIa (pT2 N0 M0) ER/PR positive invasive mammary carcinoma.  REFERRING PROVIDER: Lenon Layman ORN, MD  HPI: Patient is a 53 year old female now out 1 month having pleated whole breast radiation to her right breast for stage IIa ER positive invasive mammary carcinoma.  Seen today in routine follow-up she is doing well.  She specifically denies breast tenderness cough or bone pain..  She has been started on tamoxifen tolerating that well she does have some mood swings.  COMPLICATIONS OF TREATMENT: none  FOLLOW UP COMPLIANCE: keeps appointments   PHYSICAL EXAM:  BP (!) 115/57   Pulse 69   Temp 98 F (36.7 C)   Resp 15   Ht 5' 8 (1.727 m)   Wt 190 lb (86.2 kg)   BMI 28.89 kg/m  Lungs are clear to A&P cardiac examination essentially unremarkable with regular rate and rhythm. No dominant mass or nodularity is noted in either breast in 2 positions examined. Incision is well-healed. No axillary or supraclavicular adenopathy is appreciated. Cosmetic result is excellent.  Well-developed well-nourished patient in NAD. HEENT reveals PERLA, EOMI, discs not visualized.  Oral cavity is clear. No oral mucosal lesions are identified. Neck is clear without evidence of cervical or supraclavicular adenopathy. Lungs are clear to A&P. Cardiac examination is essentially unremarkable with regular rate and rhythm without murmur rub or thrill. Abdomen is benign with no organomegaly or masses noted. Motor sensory and DTR levels are equal and symmetric in the upper and lower extremities. Cranial nerves II through XII are grossly intact. Proprioception is intact. No peripheral adenopathy or edema is identified. No motor or sensory levels are noted. Crude visual fields are  within normal range.  RADIOLOGY RESULTS: No current films for review  PLAN: Present time patient is doing well 1 month out from whole breast radiation and pleased with her overall progress.  Of asked to see her back in 6 months for follow-up.  She continues on tamoxifen without significant side effect.  Patient knows to call with any concerns.  I would like to take this opportunity to thank you for allowing me to participate in the care of your patient.SABRA Marcey Penton, MD

## 2024-04-14 ENCOUNTER — Other Ambulatory Visit: Payer: Self-pay

## 2024-04-14 DIAGNOSIS — C50911 Malignant neoplasm of unspecified site of right female breast: Secondary | ICD-10-CM

## 2024-05-09 ENCOUNTER — Encounter: Payer: Self-pay | Admitting: *Deleted

## 2024-05-10 ENCOUNTER — Inpatient Hospital Stay: Payer: Self-pay | Admitting: *Deleted

## 2024-05-10 ENCOUNTER — Encounter: Payer: Self-pay | Admitting: Internal Medicine

## 2024-05-10 ENCOUNTER — Inpatient Hospital Stay: Payer: Self-pay | Attending: Internal Medicine | Admitting: Internal Medicine

## 2024-05-10 VITALS — BP 112/73 | HR 74 | Temp 97.9°F | Ht 68.0 in | Wt 187.0 lb

## 2024-05-10 DIAGNOSIS — Z1721 Progesterone receptor positive status: Secondary | ICD-10-CM | POA: Insufficient documentation

## 2024-05-10 DIAGNOSIS — E039 Hypothyroidism, unspecified: Secondary | ICD-10-CM | POA: Insufficient documentation

## 2024-05-10 DIAGNOSIS — Z170 Estrogen receptor positive status [ER+]: Secondary | ICD-10-CM | POA: Diagnosis not present

## 2024-05-10 DIAGNOSIS — Z1732 Human epidermal growth factor receptor 2 negative status: Secondary | ICD-10-CM | POA: Insufficient documentation

## 2024-05-10 DIAGNOSIS — I10 Essential (primary) hypertension: Secondary | ICD-10-CM | POA: Insufficient documentation

## 2024-05-10 DIAGNOSIS — Z803 Family history of malignant neoplasm of breast: Secondary | ICD-10-CM | POA: Diagnosis not present

## 2024-05-10 DIAGNOSIS — Z9189 Other specified personal risk factors, not elsewhere classified: Secondary | ICD-10-CM

## 2024-05-10 DIAGNOSIS — C50211 Malignant neoplasm of upper-inner quadrant of right female breast: Secondary | ICD-10-CM | POA: Diagnosis not present

## 2024-05-10 DIAGNOSIS — Z79899 Other long term (current) drug therapy: Secondary | ICD-10-CM | POA: Diagnosis not present

## 2024-05-10 DIAGNOSIS — Z79810 Long term (current) use of selective estrogen receptor modulators (SERMs): Secondary | ICD-10-CM | POA: Diagnosis not present

## 2024-05-10 MED ORDER — TAMOXIFEN CITRATE 20 MG PO TABS: 20.0000 mg | ORAL_TABLET | Freq: Every day | ORAL | 1 refills | Status: AC

## 2024-05-10 NOTE — Assessment & Plan Note (Addendum)
 JULY 2025- # Breast, right, stage I [pT2 pN0; M0- G-1 ER-100; PR-95%; her 2 NEG Her 2 NEG.- INVASIVE MAMMARY CARCINOMA, NO SPECIAL TYPE (DUCTAL); grade 1-status postlumpectomy Dr. Lane.    # LEFT BREAST -atypical lobular hyperplasia.  Oncotype- RS- 6- < 3% risk of recurrence.   # tolerating   Tamoxifen - well except for except for mild mood swings/dysthymia.  # Dysthymia no frank depression.  Patient symptoms are self-limited.  Denies any longstanding symptoms or suicidal ideation.  Discussed regarding evaluation with psychiatry/counseling.  Patient declines at this time as symptoms are quite minimal at this time.  Will monitor closely.  Patient will call us  if any symptoms worsen  # Bone health- ca+ vit D OTC.   # DISPOSITION: # follow up in  6 months- MD; no labs- - Dr.B

## 2024-05-10 NOTE — Progress Notes (Signed)
 Patient states no questions and concerns today.

## 2024-05-10 NOTE — Progress Notes (Signed)
 [facility] CONSULT NOTE  Patient Care Team: Lenon Layman ORN, MD as PCP - General (Internal Medicine) Schermerhorn, Debby PARAS, MD as Referring Physician (Obstetrics and Gynecology) Dessa, Reyes ORN, MD (General Surgery) Georgina Shasta POUR, RN as Oncology Nurse Navigator Rennie Cindy SAUNDERS, MD as Consulting Physician (Oncology) Lenn Aran, MD as Consulting Physician (Radiation Oncology)  CHIEF COMPLAINTS/PURPOSE OF CONSULTATION: Breast cancer  #  Oncology History Overview Note  IMPRESSION: 1. In the RIGHT breast, there is a subtle area of architectural distortion with a favored 7 mm sonographic correlate at 1 o'clock. Recommend ultrasound-guided biopsy for definitive characterization. Recommend attention on post marker placement mammogram to assess for mammographic/sonographic correlation. 2. There is a 5 mm group of indeterminate calcifications in the LEFT breast. Recommend stereotactic guided biopsy for definitive characterization. 3. Unchanged probably benign LEFT 4 mm breast mass at 3 o'clock 7 cm from the nipple. Option for continued follow-up versus definitive characterization with biopsy was discussed with patient. Patient would prefer to proceed with definitive characterization at this point in time. As such, recommend ultrasound-guided biopsy for definitive characterization. 4. 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 Interval resolution of the second adjacent mass, consistent with a benign etiology. 5. Additional benign LEFT breast cyst is noted at 3 o'clock 7 cm from the nipple. 6. No suspicious RIGHT axillary adenopathy. 7. Given history of atypical lobular hyperplasia, recommend consideration of supplemental screening with breast MRI with and without contrast.   RECOMMENDATION: 1. RIGHT breast ultrasound-guided biopsy x1 2. LEFT breast ultrasound-guided biopsy x1 3. LEFT breast stereotactic guided biopsy x1   I have  discussed the findings and recommendations with the patient. The biopsy procedure was discussed with the patient and questions were answered. Patient expressed their understanding of the biopsy recommendation. Patient will be scheduled for biopsy at her earliest convenience by the schedulers. Ordering provider will be notified. If applicable, a reminder letter will be sent to the patient regarding the next appointment.   BI-RADS CATEGORY  4: Suspicious.   Electronically Signed: By: Corean Salter M.D. On: 10/08/2023 16:27  1. 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       - TUBULE FORMATION: SCORE 1       - NUCLEAR PLEOMORPHISM: SCORE 2       - MITOTIC COUNT: SCORE 1       - TOTAL SCORE: 4       - OVERALL GRADE: 1       - LYMPHOVASCULAR INVASION: NOT IDENTIFIED       - CANCER LENGTH: 7 MM       - CALCIFICATIONS: PRESENT       - DUCTAL CARCINOMA IN SITU: PRESENT, INTERMEDIATE GRADE       - SEE NOTE.   ER-100; PR-95%; her 2 NEG   Carcinoma of upper-inner quadrant of right breast in female, estrogen receptor positive (HCC)  10/27/2023 Initial Diagnosis   Carcinoma of upper-inner quadrant of right breast in female, estrogen receptor positive (HCC)   10/27/2023 Cancer Staging   Staging form: Breast, AJCC 8th Edition - Pathologic: Stage IA (pT2, pN0, cM0, G1, ER+, PR+, HER2-) - Signed by Rennie Cindy SAUNDERS, MD on 12/08/2023 Histologic grading system: 3 grade system    Genetic Testing   No pathogenic variants identified on the [facility] CancerNext-Expanded+RNA Panel. VUS in DDX41 called p.P35L identified. The report date is 12/19/2023.  The CancerNext-Expanded gene panel offered by [facility] and includes sequencing, rearrangement, and RNA analysis for the following 77 genes: AIP, ALK, APC, ATM, AXIN2, BAP1, BARD1, BMPR1A, BRCA1, BRCA2, BRIP1, CDC73, CDH1, CDK4, CDKN1B, CDKN2A, CEBPA, CHEK2,  CTNNA1, DDX41, DICER1, ETV6, FH, FLCN, GATA2, LZTR1, MAX, MBD4, MEN1, MET, MLH1, MSH2, MSH3, MSH6, MUTYH, NF1, NF2, NTHL1, PALB2, PHOX2B, PMS2, POT1, PRKAR1A, PTCH1, PTEN, RAD51C, RAD51D, RB1, RET, RPS20, RUNX1, SDHA, SDHAF2, SDHB, SDHC, SDHD, SMAD4, SMARCA4, SMARCB1, SMARCE1, STK11, SUFU, TMEM127, TP53, TSC1, TSC2, VHL, and WT1 (sequencing and deletion/duplication); EGFR, HOXB13, KIT, MITF, PDGFRA, POLD1, and POLE (sequencing only); EPCAM and GREM1 (deletion/duplication only).      HISTORY OF PRESENTING ILLNESS: Patient ambulating-independently. Alone.   Hadassah HERO Bain 54 y.o.  female perimenopausal with no prior history of stage I right-sided ER/PR positive HER2 negative breast cancer s/p  lumpectomy- is here for a follow up.   Discussed the use of AI scribe software for clinical note transcription with the patient, who gave verbal consent to proceed.  History of Present Illness   DAMYIA STRIDER is a 54 year old female with early stage, estrogen receptor positive right breast cancer, status post surgery, presenting for routine oncology follow-up.  She is currently receiving adjuvant tamoxifen  therapy. Surveillance imaging is ongoing, with her next mammogram and breast ultrasound scheduled for January 20th as part of routine post-treatment monitoring. She has transitioned to follow-up with a new breast surgeon due to her previous surgeon's retirement.  She reports mood changes since initiating tamoxifen , describing episodes of sadness occurring approximately once weekly and lasting a full day. She denies irritability, major depressive symptoms, or suicidal ideation, and did not experience these symptoms prior to her cancer diagnosis. She reports mood changes since initiating tamoxifen , describing episodes of sadness occurring approximately once weekly and lasting a full day. She denies irritability, major depressive symptoms, or suicidal ideation, and did not experience these symptoms prior to her  cancer diagnosis.       Review of Systems  Constitutional:  Negative for chills, diaphoresis, fever, malaise/fatigue and weight loss.  HENT:  Negative for nosebleeds and sore throat.   Eyes:  Negative for double vision.  Respiratory:  Negative for cough, hemoptysis, sputum production, shortness of breath and wheezing.   Cardiovascular:  Negative for chest pain, palpitations, orthopnea and leg swelling.  Gastrointestinal:  Negative for abdominal pain, blood in stool, constipation, diarrhea, heartburn, melena, nausea and vomiting.  Genitourinary:  Negative for dysuria, frequency and urgency.  Musculoskeletal:  Negative for back pain and joint pain.  Skin: Negative.  Negative for itching and rash.  Neurological:  Negative for dizziness, tingling, focal weakness, weakness and headaches.  Endo/Heme/Allergies:  Does not bruise/bleed easily.  Psychiatric/Behavioral:  Negative for depression. The patient is not nervous/anxious and does not have insomnia.      MEDICAL HISTORY:  Past Medical History:  Diagnosis Date   Cancer (HCC)    breast   Hypertension    Hypothyroidism    Thyroid disease     SURGICAL HISTORY: Past Surgical History:  Procedure Laterality Date   AXILLARY SENTINEL NODE BIOPSY Right 11/18/2023   Procedure: BIOPSY, LYMPH NODE, SENTINEL, AXILLARY;  Surgeon: Lane Shope, MD;  Location: [facility];  Service: General;  Laterality: Right;   BREAST BIOPSY Left 08/2022   Atypical lobular hyperplasia   BREAST BIOPSY Bilateral 10/19/2023   2 u/s bx lt (ribbon) rt ( ) one stereo (x) clip   BREAST BIOPSY Right 10/19/2023   US  RT BREAST BX W  LOC DEV 1ST LESION IMG BX SPEC US  GUIDE 10/19/2023 [facility]   BREAST BIOPSY Left 10/19/2023   MM LT BREAST BX W LOC DEV 1ST LESION IMAGE BX SPEC STEREO GUIDE 10/19/2023 [facility]   BREAST BIOPSY Left 10/19/2023   US  LT BREAST BX W LOC DEV EA ADD LESION IMG BX SPEC US  GUIDE 10/19/2023 [facility]   BREAST BIOPSY Right  11/02/2023   MM RT BREAST SAVI/RF TAG 1ST LESION MAMMO GUIDE 11/02/2023 [facility]   BREAST LUMPECTOMY WITH RADIO FREQUENCY LOCALIZER Right 11/18/2023   Procedure: BREAST LUMPECTOMY WITH RADIO FREQUENCY LOCALIZER;  Surgeon: Lane Shope, MD;  Location: [facility];  Service: General;  Laterality: Right;   BREAST SURGERY Left 10/31/2013   Radial scar without atypia   COLONOSCOPY     FINE NEEDLE ASPIRATION Left    when she was in college - left breast   WISDOM TOOTH EXTRACTION  05/05/1988    SOCIAL HISTORY: Social History   Socioeconomic History   Marital status: Divorced    Spouse name: Not on file   Number of children: Not on file   Years of education: Not on file   Highest education level: Not on file  Occupational History   Not on file  Tobacco Use   Smoking status: Never   Smokeless tobacco: Never  Vaping Use   Vaping status: Never Used  Substance and Sexual Activity   Alcohol use: No   Drug use: No   Sexual activity: Not on file  Other Topics Concern   Not on file  Social History Narrative   Lives at home with 26 yr old son   Social Drivers of Health   Tobacco Use: Low Risk (05/10/2024)   Patient History    Smoking Tobacco Use: Never    Smokeless Tobacco Use: Never    Passive Exposure: Not on file  Financial Resource Strain: Low Risk  (09/25/2022)   Received from [facility]   Overall Financial Resource Strain (CARDIA)    Difficulty of Paying Living Expenses: Not hard at all  Food Insecurity: No Food Insecurity (10/27/2023)   Epic    Worried About Radiation Protection Practitioner of Food in the Last Year: Never true    Ran Out of Food in the Last Year: Never true  Transportation Needs: No Transportation Needs (10/27/2023)   Epic    Lack of Transportation (Medical): No    Lack of Transportation (Non-Medical): No  Physical Activity: Not on file  Stress: Not on file  Social Connections: Not on file  Intimate Partner Violence: Not At Risk (10/27/2023)   Epic     Fear of Current or Ex-Partner: No    Emotionally Abused: No    Physically Abused: No    Sexually Abused: No  Depression (PHQ2-9): Low Risk (05/10/2024)   Depression (PHQ2-9)    PHQ-2 Score: 1  Alcohol Screen: Not on file  Housing: Low Risk (10/27/2023)   Epic    Unable to Pay for Housing in the Last Year: No    Number of Times Moved in the Last Year: 0    Homeless in the Last Year: No  Utilities: Not At Risk (10/27/2023)   Epic    Threatened with loss of utilities: No  Health Literacy: Not on file    FAMILY HISTORY: Family History  Problem Relation Age of Onset   Hypertension Mother    Thyroid disease Mother    Hypertension Father    Breast cancer Maternal Aunt  dx mid 87s   Cancer Maternal Grandfather        prostate vs GI vs other GU   Aneurysm Paternal Grandmother    Prostate cancer Paternal Grandfather        dx 51s-70s    ALLERGIES:  has no known allergies.  MEDICATIONS:  Current Outpatient Medications  Medication Sig Dispense Refill   cetirizine (ZYRTEC) 10 MG tablet Take 10 mg by mouth in the morning.     Cyanocobalamin (HM SUPER VITAMIN B12 PO) Take 1 tablet by mouth 2 (two) times a week.     levothyroxine (SYNTHROID, LEVOTHROID) 75 MCG tablet Take 75 mcg by mouth daily before breakfast.     losartan-hydrochlorothiazide (HYZAAR) 100-12.5 MG tablet Take 1 tablet by mouth in the morning.     tamoxifen  (NOLVADEX ) 20 MG tablet Take 1 tablet (20 mg total) by mouth daily. 90 tablet 1   No current facility-administered medications for this visit.   PHYSICAL EXAMINATION:   Vitals:   05/10/24 1421  BP: 112/73  Pulse: 74  Temp: 97.9 F (36.6 C)  SpO2: 99%    Filed Weights   05/10/24 1421  Weight: 187 lb (84.8 kg)     Physical Exam Vitals and nursing note reviewed.  HENT:     Head: Normocephalic and atraumatic.     Mouth/Throat:     Pharynx: Oropharynx is clear.  Eyes:     Extraocular Movements: Extraocular movements intact.     Pupils: Pupils  are equal, round, and reactive to light.  Cardiovascular:     Rate and Rhythm: Normal rate and regular rhythm.  Pulmonary:     Comments: Decreased breath sounds bilaterally.  Abdominal:     Palpations: Abdomen is soft.  Musculoskeletal:        General: Normal range of motion.     Cervical back: Normal range of motion.  Skin:    General: Skin is warm.  Neurological:     General: No focal deficit present.     Mental Status: She is alert and oriented to person, place, and time.  Psychiatric:        Behavior: Behavior normal.        Judgment: Judgment normal.      LABORATORY DATA:  I have reviewed the data as listed Lab Results  Component Value Date   WBC 7.2 01/13/2024   HGB 13.0 01/13/2024   HCT 40.0 01/13/2024   MCV 86.8 01/13/2024   PLT 291 01/13/2024   Recent Labs    11/11/23 1357  NA 138  K 3.4*  CL 104  CO2 26  GLUCOSE 92  BUN 16  CREATININE 0.71  CALCIUM 9.4  GFRNONAA >60  PROT 7.5  ALBUMIN 3.8  AST 19  ALT 22  ALKPHOS 62  BILITOT 0.8    RADIOGRAPHIC STUDIES: I have personally reviewed the radiological images as listed and agreed with the findings in the report. No results found.   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 Lane.    # LEFT BREAST -atypical lobular hyperplasia.  Oncotype- RS- 6- < 3% risk of recurrence.   # tolerating   Tamoxifen - well except for except for mild mood swings/dysthymia.  # Dysthymia no frank depression.  Patient symptoms are self-limited.  Denies any longstanding symptoms or suicidal ideation.  Discussed regarding evaluation with psychiatry/counseling.  Patient declines at this time as symptoms are quite minimal at this time.  Will monitor closely.  Patient will call  us  if any symptoms worsen  # Bone health- ca+ vit D OTC.   # DISPOSITION: # follow up in  6 months- MD; no labs- - Dr.B     All questions were answered. The patient/family knows to call the clinic with any problems, questions or concerns.   Cindy JONELLE Joe, MD 05/10/2024 3:12 PM

## 2024-05-10 NOTE — Progress Notes (Signed)
 SUBJECTIVE: Pt returns for her 3 month L-Dex screen.    PAIN:  Are you having pain? No   SOZO SCREENING: Patient was assessed today using the SOZO machine to determine the lymphedema index score. This was compared to her baseline score. It was determined that she is within the recommended range when compared to her baseline and no further action is needed at this time. She will continue SOZO screenings. These are done every 3 months for 2 years post operatively followed by every 6 months for 2 years, and then annually.     L-DEX FLOWSHEETS                L-DEX LYMPHEDEMA SCREENING    Measurement Type Unilateral     L-DEX MEASUREMENT EXTREMITY Upper Extremity     POSITION  Standing     DOMINANT SIDE Right     At Risk Side Right    BASELINE SCORE (UNILATERAL) 1.8    L-DEX SCORE (UNILATERAL) 3.7    VALUE CHANGE (UNILAT) 1.9

## 2024-05-16 ENCOUNTER — Ambulatory Visit

## 2024-05-16 DIAGNOSIS — Z1283 Encounter for screening for malignant neoplasm of skin: Secondary | ICD-10-CM

## 2024-05-16 DIAGNOSIS — L578 Other skin changes due to chronic exposure to nonionizing radiation: Secondary | ICD-10-CM | POA: Diagnosis not present

## 2024-05-16 DIAGNOSIS — D229 Melanocytic nevi, unspecified: Secondary | ICD-10-CM

## 2024-05-16 DIAGNOSIS — L814 Other melanin hyperpigmentation: Secondary | ICD-10-CM

## 2024-05-16 DIAGNOSIS — L821 Other seborrheic keratosis: Secondary | ICD-10-CM

## 2024-05-16 DIAGNOSIS — L918 Other hypertrophic disorders of the skin: Secondary | ICD-10-CM

## 2024-05-16 DIAGNOSIS — D1801 Hemangioma of skin and subcutaneous tissue: Secondary | ICD-10-CM

## 2024-05-16 DIAGNOSIS — W908XXA Exposure to other nonionizing radiation, initial encounter: Secondary | ICD-10-CM

## 2024-05-16 DIAGNOSIS — D489 Neoplasm of uncertain behavior, unspecified: Secondary | ICD-10-CM

## 2024-05-16 DIAGNOSIS — D485 Neoplasm of uncertain behavior of skin: Secondary | ICD-10-CM

## 2024-05-16 DIAGNOSIS — C4372 Malignant melanoma of left lower limb, including hip: Secondary | ICD-10-CM | POA: Diagnosis not present

## 2024-05-16 DIAGNOSIS — C439 Malignant melanoma of skin, unspecified: Secondary | ICD-10-CM

## 2024-05-16 HISTORY — DX: Malignant melanoma of skin, unspecified: C43.9

## 2024-05-16 NOTE — Progress Notes (Signed)
 "   Subjective   Yolanda Gill is a 54 y.o. female who presents for the following: Total body skin exam for skin cancer screening and mole check. The patient has spots, moles and lesions to be evaluated, some may be new or changing and the patient may have concern these could be cancer.. Patient is new patient  Today patient reports: Patient is present for a TBSE, she states she has a spot on her left ankle and lower back she would like to get focused on. Hx of breast cancer.  Review of Systems:    No other skin or systemic complaints except as noted in HPI or Assessment and Plan.  The following portions of the chart were reviewed this encounter and updated as appropriate: medications, allergies, medical history  Relevant Medical History:  n/a   Objective  (SKPE) Well appearing patient in no apparent distress; mood and affect are within normal limits. Examination was performed of the: Full Skin Examination: scalp, head, eyes, ears, nose, lips, neck, chest, axillae, abdomen, back, buttocks, bilateral upper extremities, bilateral lower extremities, hands, feet, fingers, toes, fingernails, and toenails.   Examination notable for: SKIN EXAM, Angioma(s): Scattered red vascular papule(s)  , Lentigo/lentigines: Scattered pigmented macules that are tan to brown in color and are somewhat non-uniform in shape and concentrated in the sun-exposed areas, Nevus/nevi: Scattered well-demarcated, regular, pigmented macule(s) and/or papule(s)  , Seborrheic Keratosis(es): Stuck-on appearing keratotic papule(s) on the trunk, none  irritated with redness, crusting, edema, and/or partial avulsion, Actinic Damage/Elastosis: chronic sun damage: dyspigmentation, telangiectasia, and wrinkling  Examination limited by: Undergarments   Left lateral lower extremity 8 mm irregular brown macule   Assessment & Plan  (SKAP)   SKIN CANCER SCREENING PERFORMED TODAY.  BENIGN SKIN FINDINGS  - Lentigines  - Seborrheic  keratoses  - Hemangiomas   - Nevus/Multiple Benign Nevi  - Acrochordons  - Reassurance provided regarding the benign appearance of lesions noted on exam today; no treatment is indicated in the absence of symptoms/changes. - Reinforced importance of photoprotective strategies including liberal and frequent sunscreen use of a broad-spectrum SPF 30 or greater, use of protective clothing, and sun avoidance for prevention of cutaneous malignancy and photoaging.  Counseled patient on the importance of regular self-skin monitoring as well as routine clinical skin examinations as scheduled.   ACTINIC DAMAGE - Chronic condition, secondary to cumulative UV/sun exposure - Recommend daily broad spectrum sunscreen SPF 30+ to sun-exposed areas, reapply every 2 hours as needed.  - Staying in the shade or wearing long sleeves, sun glasses (UVA+UVB protection) and wide brim hats (4-inch brim around the entire circumference of the hat) are also recommended for sun protection.  - Call for new or changing lesions.   Was sun protection counseling provided?: Yes   Level of service outlined above   Patient instructions (SKPI)   Procedures, orders, diagnosis for this visit:  NEOPLASM OF UNCERTAIN BEHAVIOR Left lateral lower extremity - Epidermal / dermal shaving  Lesion diameter (cm):  0.8 Informed consent: discussed and consent obtained   Timeout: patient name, date of birth, surgical site, and procedure verified   Procedure prep:  Patient was prepped and draped in usual sterile fashion Prep type:  Isopropyl alcohol Anesthesia: the lesion was anesthetized in a standard fashion   Anesthetic:  1% lidocaine  w/ epinephrine  1-100,000 buffered w/ 8.4% NaHCO3 Instrument used: DermaBlade   Hemostasis achieved with: pressure and aluminum chloride   Outcome: patient tolerated procedure well   Post-procedure details: wound care  instructions given    Specimen 1 - Surgical pathology Differential Diagnosis:  melanoma vs dysplastic  Check Margins: No LENTIGO   SKIN EXAM FOR MALIGNANT NEOPLASM   SEBORRHEIC KERATOSIS   MULTIPLE BENIGN NEVI   CHERRY ANGIOMA   ACTINIC SKIN DAMAGE   NEOPLASM OF UNCERTAIN BEHAVIOR OF SKIN    Neoplasm of uncertain behavior -     Epidermal / dermal shaving -     Surgical pathology; Standing  Lentigo  Skin exam for malignant neoplasm  Seborrheic keratosis  Multiple benign nevi  Cherry angioma  Actinic skin damage  Neoplasm of uncertain behavior of skin    Return to clinic: Return for Pending biopsy results, w/ Dr. Raymund.  I, Almetta Nora, RMA, am acting as scribe for Lauraine JAYSON Raymund, MD .   Documentation: I have reviewed the above documentation for accuracy and completeness, and I agree with the above.  Lauraine JAYSON Raymund, MD  "

## 2024-05-16 NOTE — Patient Instructions (Addendum)
 Sun protection [employer]    Seborrheic Keratosis  What causes seborrheic keratoses? Seborrheic keratoses are harmless, common skin growths that first appear during adult life.  As time goes by, more growths appear.  Some people may develop a large number of them.  Seborrheic keratoses appear on both covered and uncovered body parts.  They are not caused by sunlight.  The tendency to develop seborrheic keratoses can be inherited.  They vary in color from skin-colored to gray, brown, or even black.  They can be either smooth or have a rough, warty surface.   Seborrheic keratoses are superficial and look as if they were stuck on the skin.  Under the microscope this type of keratosis looks like layers upon layers of skin.  That is why at times the top layer may seem to fall off, but the rest of the growth remains and re-grows.    Treatment Seborrheic keratoses do not need to be treated, but can easily be removed in the office.  Seborrheic keratoses often cause symptoms when they rub on clothing or jewelry.  Lesions can be in the way of shaving.  If they become inflamed, they can cause itching, soreness, or burning.  Removal of a seborrheic keratosis can be accomplished by freezing, burning, or surgery. If any spot bleeds, scabs, or grows rapidly, please return to have it checked, as these can be an indication of a skin cancer.  Sunscreen  Who needs sunscreen? Everyone. Sunscreen use can help prevent skin cancer by protecting you from the sun's harmful ultraviolet rays. Anyone can get skin cancer, regardless of age, gender or race. In fact, it is estimated that one in five Americans will develop skin cancer in their lifetime.  Sunscreen alone cannot fully protect you. In addition to wearing sunscreen, dermatologists recommend taking the following steps to protect your skin and find skin cancer early:  Seek shade when appropriate, remembering that the sun's rays are strongest between 10 a.m. and  2 p.m. If your shadow is shorter than you are, seek shade. Dress to protect yourself from the sun by wearing a lightweight long-sleeved shirt, pants, a wide-brimmed hat and sunglasses, when possible.  Use extra caution near water, snow and sand as they reflect the damaging rays of the sun, which can increase your chance of sunburn.  Get vitamin D safely through a healthy diet that may include vitamin supplements. Don't seek the sun. Avoid tanning beds. Ultraviolet light from the sun and tanning beds can cause skin cancer and wrinkling. If you want to look tan, you may wish to use a self-tanning product, but continue to use sunscreen with it.  When should I use sunscreen? Every day you go outside--even if you're just walking to and from your form of transportation. The sun emits harmful UV rays year-round. Even on cloudy days, up to 80 percent of the sun's harmful UV rays can penetrate your skin. Snow, sand and water increase the need for sunscreen because they reflect the sun's rays.  How much sunscreen should I use, and how often should I apply it? Most people only apply 25-50 percent of the recommended amount of sunscreen. Apply enough sunscreen to cover all exposed skin. Most adults need about 1 ounce -- or enough to fill a shot glass -- to fully cover their body.  Don't forget to apply to the tops of your feet, your neck, your ears and the top of your head. Apply sunscreen to dry skin 15 minutes before going  outdoors.  Skin cancer also can form on the lips. To protect your lips, apply a lip balm or lipstick that contains sunscreen with an SPF of 30 or higher.  When outdoors, reapply sunscreen approximately every two hours, or after swimming or sweating, according to the directions on the bottle.   Broad-spectrum sunscreens protect against both UVA and UVB rays. What is the difference between the rays? Sunlight consists of two types of harmful rays that reach the earth -- UVA rays and UVB rays.  Overexposure to either can lead to skin cancer. In addition to causing skin cancer, here's what each of these rays do:  UVA rays (or aging rays) can prematurely age your skin, causing wrinkles and age spots, and can pass through window glass. UVB rays (or burning rays) are the primary cause of sunburn and are blocked by window glass  There is no safe way to tan. Every time you tan, you damage your skin. As this damage builds, you speed up the aging of your skin and increase your risk for all types of skin cancer.  What is the difference between chemical and physical sunscreens? Chemical sunscreens work like a sponge, absorbing the sun's rays. They contain one or more of the following active ingredients: oxybenzone, avobenzone, octisalate, octocrylene, homosalate and octinoxate. These formulations tend to be easier to rub into the skin without leaving a white residue.   Physical sunscreens work like a shield, sitting sit on the surface of your skin and deflecting the sun's rays. They contain the active ingredients zinc oxide and/or titanium dioxide. Use this sunscreen if you have sensitive skin.   What type of sunscreen should I use? The best type of sunscreen is the one you will use again and again. Just make sure it offers broad-spectrum (UVA and UVB) protection, has an SPF of 30+, and is water-resistant. The kind of sunscreen you use is a matter of personal choice, and may vary depending on the area of the body to be protected. Available sunscreen options include lotions, creams, gels, ointments, wax sticks and sprays.  Recommended physical sunscreens for face: - Neutrogena Sheer Zinc - Aveeno Positively Mineral Sensitive - CeraVe Hydrating Mineral (also has a tinted version) - La Roche-Posay Anthelios Mineral Face (comes as a cream, lotion, light fluid, and there is also a tinted version).  - EltaMD UV Clear (also has a tinted version)  Recommended physical sunscreens for body: - Neutrogena  Sheer Zinc Dry-Touch Sunscreen Sensitive Skin Lotion Broad Spectrum SPF 50 - Aveeno Positively Mineral Sensitive Skin Sunscreen Broad Spectrum SPF 50 - La Roche-Posay Anthelios SPF 50 Mineral Sunscreen - Gentle Lotion - CeraVe Hydrating Mineral Sunscreen SPF 50  Recommended chemical sunscreens for face: - Anthelios UV Correct Face Sunscreen SPF 70 with Niacinamide - Neutrogena Clear Face Oil-Free SPF 50 with Helioplex - Neutrogena Sport Face Oil-Free SPF 70+ with Helioplex - Aveeno Protect + Hydrate Sunscreen For Face SPF 70 - La Roche-Posay Anthelios Light Fluid Sunscreen for Face SPF 60  Recommended chemical sunscreens for body: - Neutrogena Ultra Sheer Dry-Touch Sunscreen SPF 70 - Aveeno Protect + Hydrate Broad Spectrum All-Day Hydration SPF 60 (comes in a big pump) - La Roche-Posay Anthelios Melt-In Milk Sunscreen SPF 60   Due to recent changes in healthcare laws, you may see results of your pathology and/or laboratory studies on MyChart before the doctors have had a chance to review them. We understand that in some cases there may be results that are confusing or concerning to  you. Please understand that not all results are received at the same time and often the doctors may need to interpret multiple results in order to provide you with the best plan of care or course of treatment. Therefore, we ask that you please give us  2 business days to thoroughly review all your results before contacting the office for clarification. Should we see a critical lab result, you will be contacted sooner.   If You Need Anything After Your Visit  If you have any questions or concerns for your doctor, please call our main line at [phone] and press option 4 to reach your doctor's medical assistant. If no one answers, please leave a voicemail as directed and we will return your call as soon as possible. Messages left after 4 pm will be answered the following business day.   You may also send us  a  message via MyChart. We typically respond to MyChart messages within 1-2 business days.  For prescription refills, please ask your pharmacy to contact our office. Our fax number is [phone].  If you have an urgent issue when the clinic is closed that cannot wait until the next business day, you can page your doctor at the number below.    Please note that while we do our best to be available for urgent issues outside of office hours, we are not available 24/7.   If you have an urgent issue and are unable to reach us , you may choose to seek medical care at your doctor's office, retail clinic, urgent care center, or emergency room.  If you have a medical emergency, please immediately call 911 or go to the emergency department.  Pager Numbers  - Dr. Hester: [phone]  - Dr. Jackquline: [phone]  - Dr. Claudene: [phone]   - Dr. Raymund: [phone]  In the event of inclement weather, please call our main line at [phone] for an update on the status of any delays or closures.  Dermatology Medication Tips: Please keep the boxes that topical medications come in in order to help keep track of the instructions about where and how to use these. Pharmacies typically print the medication instructions only on the boxes and not directly on the medication tubes.   If your medication is too expensive, please contact our office at [phone] option 4 or send us  a message through MyChart.   We are unable to tell what your co-pay for medications will be in advance as this is different depending on your insurance coverage. However, we may be able to find a substitute medication at lower cost or fill out paperwork to get insurance to cover a needed medication.   If a prior authorization is required to get your medication covered by your insurance company, please allow us  1-2 business days to complete this process.  Drug prices often vary depending on where the prescription is filled and  some pharmacies may offer cheaper prices.  The website www.goodrx.com contains coupons for medications through different pharmacies. The prices here do not account for what the cost may be with help from insurance (it may be cheaper with your insurance), but the website can give you the price if you did not use any insurance.  - You can print the associated coupon and take it with your prescription to the pharmacy.  - You may also stop by our office during regular business hours and pick up a GoodRx coupon card.  - If you need your prescription sent electronically to a different pharmacy,  notify our office through [facility] or by phone at [phone] option 4.     Si Usted Necesita Algo Despus de Su Visita  Tambin puede enviarnos un mensaje a travs de Clinical Cytogeneticist. Por lo general respondemos a los mensajes de MyChart en el transcurso de 1 a 2 das hbiles.  Para renovar recetas, por favor pida a su farmacia que se ponga en contacto con nuestra oficina. Randi lakes de fax es [place] [phone].  Si tiene un asunto urgente cuando la clnica est cerrada y que no puede esperar hasta el siguiente da hbil, puede llamar/localizar a su doctor(a) al nmero que aparece a continuacin.   Por favor, tenga en cuenta que aunque hacemos todo lo posible para estar disponibles para asuntos urgentes fuera del horario de [place], no estamos disponibles las 24 horas del da, los 7 [address]  [address] de la [place].   Si tiene un problema urgente y no puede comunicarse con nosotros, puede optar por buscar atencin mdica  en el consultorio de su doctor(a), en una clnica privada, en un centro de atencin urgente o en una sala de emergencias.  Si tiene engineer, drilling, por favor llame inmediatamente al 911 o vaya a la sala de emergencias.  Nmeros de bper  - Dr. Hester: [phone]  - Dra. Jackquline: [phone]  - Dr. Claudene: [phone]  - Dra. Kitts: [phone]  En caso de inclemencias del  [place], por favor llame a nuestra lnea principal al [phone] para una actualizacin sobre el estado de cualquier retraso o cierre.  Consejos para la medicacin en dermatologa: Por favor, guarde las cajas en las que vienen los medicamentos de uso tpico para ayudarle a seguir las instrucciones sobre dnde y cmo usarlos. Las farmacias generalmente imprimen las instrucciones del medicamento slo en las cajas y no directamente en los tubos del [place].   Si su medicamento es muy caro, por favor, pngase en contacto con landry rieger llamando al [phone] y presione la opcin 4 o envenos un mensaje a travs de Clinical Cytogeneticist.   No podemos decirle cul ser su copago por los medicamentos por adelantado ya que esto es diferente dependiendo de la cobertura de su seguro. Sin embargo, es posible que podamos encontrar un medicamento sustituto a audiological scientist un formulario para que el seguro cubra el medicamento que se considera necesario.   Si se requiere una autorizacin previa para que su compaa de seguros cubra su medicamento, por favor permtanos de 1 a 2 das hbiles para completar este proceso.  Los precios de los medicamentos varan con frecuencia dependiendo del environmental consultant de dnde se surte la receta y alguna farmacias pueden ofrecer precios ms baratos.  El sitio web www.goodrx.com tiene cupones para medicamentos de health and safety inspector. Los precios aqu no tienen en cuenta lo que podra costar con la ayuda del seguro (puede ser ms barato con su seguro), pero el sitio web puede darle el precio si no utiliz tourist information centre manager.  - Puede imprimir el cupn correspondiente y llevarlo con su receta a la farmacia.  - Tambin puede pasar por nuestra oficina durante el horario de atencin regular y education officer, museum una tarjeta de cupones de GoodRx.  - Si necesita que su receta se enve electrnicamente a una farmacia diferente, informe a nuestra oficina a travs de MyChart de [place] o por telfono  llamando al [phone] y presione la opcin 4.

## 2024-05-19 ENCOUNTER — Telehealth: Payer: Self-pay

## 2024-05-19 ENCOUNTER — Ambulatory Visit: Payer: Self-pay

## 2024-05-19 DIAGNOSIS — C439 Malignant melanoma of skin, unspecified: Secondary | ICD-10-CM

## 2024-05-19 LAB — SURGICAL PATHOLOGY

## 2024-05-19 NOTE — Telephone Encounter (Signed)
-----   Message from Lauraine Kanaris, MD sent at 05/19/2024  4:26 PM EST ----- Called and notified pt of biopsy results showing melanoma. Recommended surgical excision and skin check q3 months   Please: - Schedule for skin check in 3 months with me - Place referral to Dr. 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 Skin, left lateral lower extremity :      MALIGNANT MELANOMA      MELANOMA TABLE (AJCC 8TH EDITION#)      SPECIMEN ANATOMIC SITE:   LEFT LATERAL EXTREMITY      HISTOLOGIC TYPE:   SPITZOID      BRESLOW'S DEPTH/MAXIMUM TUMOR THICKNESS:  0.2 MM      CLARK/ANATOMIC LEVEL:   II      MARGINS      PERIPHERAL MARGINS:  NARROWLY FREE      DEEP MARGIN:   FREE      ULCERATION:    ABSENT      SATELLITOSIS:   ABSENT      MITOTIC INDEX:    LESS THAN 1 PER MM/2      LYMPHO-VASCULAR INVASION:   ABSENT      NEUROTROPISM:   ABSENT      TUMOR-INFILTRATING LYMPHOCYTES:  NON-BRISK      TUMOR REGRESSION:    ABSENT      LYMPH NODES (IF APPLICABLE):   N/A      PATHOLOGIC STAGE: PT1A      COMMENT:   A COMPLETE RE-EXCISION IS RECOMMENDED.

## 2024-05-19 NOTE — Telephone Encounter (Signed)
 [facility] called and state pathology will be faxed shortly  Pathology showed malignant melanoma

## 2024-05-19 NOTE — Telephone Encounter (Signed)
 Referral sent to Dr. Corey and patient scheduled for 3 mth TBSE.

## 2024-05-23 ENCOUNTER — Encounter: Payer: Self-pay | Admitting: Dermatology

## 2024-05-24 ENCOUNTER — Ambulatory Visit
Admission: RE | Admit: 2024-05-24 | Discharge: 2024-05-24 | Disposition: A | Discharge status: Home / Self Care | Source: Ambulatory Visit | Attending: General Surgery | Admitting: General Surgery

## 2024-05-24 DIAGNOSIS — Z170 Estrogen receptor positive status [ER+]: Secondary | ICD-10-CM | POA: Diagnosis present

## 2024-05-24 DIAGNOSIS — C50911 Malignant neoplasm of unspecified site of right female breast: Secondary | ICD-10-CM | POA: Insufficient documentation

## 2024-05-25 ENCOUNTER — Ambulatory Visit: Admitting: Dermatology

## 2024-05-25 ENCOUNTER — Encounter: Payer: Self-pay | Admitting: Dermatology

## 2024-05-25 VITALS — BP 141/79 | HR 70 | Temp 97.6°F

## 2024-05-25 DIAGNOSIS — L814 Other melanin hyperpigmentation: Secondary | ICD-10-CM | POA: Diagnosis not present

## 2024-05-25 DIAGNOSIS — C4372 Malignant melanoma of left lower limb, including hip: Secondary | ICD-10-CM | POA: Diagnosis not present

## 2024-05-25 DIAGNOSIS — L578 Other skin changes due to chronic exposure to nonionizing radiation: Secondary | ICD-10-CM

## 2024-05-25 MED ORDER — MUPIROCIN 2 % EX OINT: 1.0000 | TOPICAL_OINTMENT | Freq: Two times a day (BID) | CUTANEOUS | 2 refills | Status: AC

## 2024-05-25 NOTE — Progress Notes (Signed)
 "  Follow-Up Visit   Subjective  Yolanda Gill is a 54 y.o. 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 Corey.   The following portions of the chart were reviewed this encounter and updated as appropriate: medications, allergies, medical history  Review of Systems:  No other skin or systemic complaints except as noted in HPI or Assessment and Plan.  Objective  Well appearing patient in no apparent distress; mood and affect are within normal limits.  A focused examination was performed of the following areas: Left lower legs  Relevant physical exam findings are noted in the Assessment and Plan.   Left Lower Leg - Anterior Biopsy scar   Assessment & Plan   MELANOMA OF LOWER LEG, LEFT (HCC) Left Lower Leg - Anterior - Mohs surgery  Consent obtained: written  Anticoagulation: Is the patient taking prescription anticoagulant and/or aspirin prescribed/recommended by a physician? No   Was the anticoagulation regimen changed prior to Mohs? No    Anesthesia: Anesthesia method: local infiltration Local anesthetic: lidocaine  1% WITH epi  Procedure Details: Timeout: pre-procedure verification complete Procedure Prep: patient was prepped and draped in usual sterile fashion Prep type: chlorhexidine  Pre-Op diagnosis: melanoma MohsAIQ Surgical site (if tumor spans multiple areas, please select predominant area): lower limb (including hip) Surgery side: left Surgical site (from skin exam): Left Lower Leg - Anterior Pre-operative length (cm): 1.5 Pre-operative width (cm): 1.3 Indications for Mohs surgery: ill-defined borders and aggressive histology  Micrographic Surgery Details: Post-operative length (cm): 2.4 Post-operative width (cm): 2.3 Number of Mohs stages: 1 Post surgery depth of defect: subcutaneous fat  Stage 1    Tumor features identified on Mohs section: no tumor  identified  Reconstruction: Was the defect reconstructed?: No    - Skin repair This Visit - mupirocin  ointment (BACTROBAN ) 2 % - Apply 1 Application topically 2 (two) times daily.   Return in about 4 weeks (around 06/22/2024) for wound check.  I, Doyce Pan, CMA, am acting as scribe for RUFUS CHRISTELLA COREY, MD.    05/25/2024  HISTORY OF PRESENT ILLNESS  Yolanda Gill is seen in consultation at the request of Dr. Raymund for biopsy-proven Invasive Melanoma on the . They note that the area has been present for about 6 months increasing in size with time.  There is no history of previous treatment.  Reports no other new or changing lesions and has no other complaints today.  Medications and allergies: see patient chart.  Review of systems: Reviewed 8 systems and notable for the above skin cancer.  All other systems reviewed are unremarkable/negative, unless noted in the HPI. Past medical history, surgical history, family history, social history were also reviewed and are noted in the chart/questionnaire.    PHYSICAL EXAMINATION  General: Well-appearing, in no acute distress, alert and oriented x 4. Vitals reviewed in chart (if available).   Skin: Exam reveals a 1.5 x 1.3 cm erythematous papule and biopsy scar on the left lower leg. There are rhytids, telangiectasias, and lentigines, consistent with photodamage.  Biopsy report(s) reviewed, confirming the diagnosis.   ASSESSMENT  1) Invasive Melanoma of the left lower leg 2) photodamage 3) solar lentigines   PLAN   1. Due to location, size, histology, or recurrence and the likelihood of subclinical extension as well as the need to conserve normal surrounding tissue, the patient was deemed acceptable for Mohs micrographic surgery (MMS).  The nature and  purpose of the procedure, associated benefits and risks including recurrence and scarring, possible complications such as pain, infection, and bleeding, and alternative methods of  treatment if appropriate were discussed with the patient during consent. The lesion location was verified by the patient, by reviewing previous notes, pathology reports, and by photographs as well as angulation measurements if available.  Informed consent was reviewed and signed by the patient, and timeout was performed at 8:15 AM. See op note below.  2. For the photodamage and solar lentigines, sun protection discussed/information given on OTC sunscreens, and we recommend continued regular follow-up with primary dermatologist every 6 months or sooner for any growing, bleeding, or changing lesions. 3. Prognosis and future surveillance discussed. 4. Letter with treatment outcome sent to referring provider. 5. Pain acetaminophen /ibuprofen   MOHS MICROGRAPHIC SURGERY AND RECONSTRUCTION  Initial size:   1.5 x 1.3 cm Surgical defect/wound size: 2.4 x 2.3 cm Anesthesia:    0.33% lidocaine  with 1:200,000 epinephrine  EBL:    <5 mL Complications:  None Repair type:   Second Intention  Stages: 1  STAGE I: Anesthesia achieved with 0.5% lidocaine  with 1:200,000 epinephrine . ChloraPrep applied. 5 section(s) excised using Mohs technique (this includes total peripheral and deep tissue margin excision and evaluation with frozen sections, excised and interpreted by the same physician). The tumor was first debulked and then excised with an approx. 2mm margin.  Hemostasis was achieved with electrocautery as needed.  The specimen was then oriented, subdivided/relaxed, inked, and processed using Mohs technique.   Tissue was stained with H&E and MART-1 with 1 chromogen.    Frozen section analysis revealed a clear deep and peripheral margin.  Reconstruction  Patient was notified of results and repair options were discussed, including second intention healing. After reviewing the advantages and disadvantages of each, we agreed on second intention healing as appropriate.   The surgical site was then lightly scrubbed  with sterile, saline-soaked gauze.  The area was bandaged using Vaseline ointment, non-adherent gauze, gauze pads, and tape to provide an adequate pressure dressing.   The patient tolerated the procedure well, was given detailed written and verbal wound care instructions, and was discharged in good condition.  The patient will follow-up in 4 weeks and as scheduled with primary dermatologist.    Documentation: I have reviewed the above documentation for accuracy and completeness, and I agree with the above.  RUFUS CHRISTELLA HOLY, MD  "

## 2024-05-25 NOTE — Patient Instructions (Signed)

## 2024-05-31 ENCOUNTER — Ambulatory Visit: Payer: Self-pay | Admitting: General Surgery

## 2024-05-31 ENCOUNTER — Encounter: Payer: Self-pay | Admitting: General Surgery

## 2024-05-31 VITALS — BP 114/81 | HR 77 | Ht 68.0 in | Wt 188.0 lb

## 2024-05-31 DIAGNOSIS — Z08 Encounter for follow-up examination after completed treatment for malignant neoplasm: Secondary | ICD-10-CM | POA: Diagnosis not present

## 2024-05-31 DIAGNOSIS — Z170 Estrogen receptor positive status [ER+]: Secondary | ICD-10-CM | POA: Diagnosis not present

## 2024-05-31 DIAGNOSIS — C50911 Malignant neoplasm of unspecified site of right female breast: Secondary | ICD-10-CM | POA: Diagnosis not present

## 2024-05-31 NOTE — Progress Notes (Signed)
 Outpatient Surgical Follow Up  05/31/2024  Yolanda Gill is an 53 y.o. female.   Chief Complaint  Patient presents with   Follow-up    HPI: The patient returns today status post right lumpectomy with sentinel lymph node biopsy for cancer.  In the interim she has completed radiation therapy and is currently on tamoxifen .  She says that the tamoxifen  does give her mood changes but she denies any hot flashes.  She is tolerating it well.  She denies any overlying skin changes or lumps palpated in her breast.  She denies any nipple discharge.  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   Thyroid disease     Past Surgical History:  Procedure Laterality Date   AXILLARY SENTINEL NODE BIOPSY Right 11/18/2023   Procedure: BIOPSY, LYMPH NODE, SENTINEL, AXILLARY;  Surgeon: Lane Shope, MD;  Location: [facility];  Service: General;  Laterality: Right;   BREAST BIOPSY Left 08/2022   Atypical lobular hyperplasia   BREAST BIOPSY Bilateral 10/19/2023   2 u/s bx lt (ribbon) rt ( ) one stereo (x) clip   BREAST BIOPSY Right 10/19/2023   US  RT BREAST BX W LOC DEV 1ST LESION IMG BX SPEC US  GUIDE 10/19/2023 [facility]   BREAST BIOPSY Left 10/19/2023   MM LT BREAST BX W LOC DEV 1ST LESION IMAGE BX SPEC STEREO GUIDE 10/19/2023 [facility]   BREAST BIOPSY Left 10/19/2023   US  LT BREAST BX W LOC DEV EA ADD LESION IMG BX SPEC US  GUIDE 10/19/2023 [facility]   BREAST BIOPSY Right 11/02/2023   MM RT BREAST SAVI/RF TAG 1ST LESION MAMMO GUIDE 11/02/2023 [facility]   BREAST LUMPECTOMY WITH RADIO FREQUENCY LOCALIZER Right 11/18/2023   Procedure: BREAST LUMPECTOMY WITH RADIO FREQUENCY LOCALIZER;  Surgeon: Lane Shope, MD;  Location: [facility];  Service: General;   Laterality: Right;   BREAST SURGERY Left 10/31/2013   Radial scar without atypia   COLONOSCOPY     FINE NEEDLE ASPIRATION Left    when she was in college - left breast   WISDOM TOOTH EXTRACTION  05/05/1988    Family History  Problem Relation Age of Onset   Hypertension Mother    Thyroid disease Mother    Hypertension Father    Breast cancer Maternal Aunt        dx mid 66s   Cancer Maternal Grandfather        prostate vs GI vs other GU   Aneurysm Paternal Grandmother    Prostate cancer Paternal Grandfather        dx 12s-70s    Social History:  reports that she has never smoked. She has never been exposed to tobacco smoke. She has never used smokeless tobacco. She reports that she does not drink alcohol and does not use drugs.  Allergies: Allergies[1]  Medications reviewed.    ROS Full ROS performed and is otherwise negative other than what is stated in HPI   BP 114/81   Pulse 77   Ht 5' 8 (1.727 m)   Wt 188 lb (85.3 kg)   SpO2 98%   BMI 28.59 kg/m   Physical Exam  Alert and oriented x 3, normal work of breathing room air, regular rate and rhythm, abdomen is soft, nontender  nondistended, breast exam performed in the presence of a chaperone.  Right breast without any axillary lymphadenopathy.  Right axillary incision is healing well.  Right breast incision is healed well and there is a little bit of contracture right at the incision.  No dominant masses or lesions.  No nipple discharge.  On the left breast there is a well-healed scar without any dominant masses or lesions.  No axillary lymphadenopathy and no nipple discharge.    RECOMMENDATION: Recommend bilateral diagnostic mammogram (with RIGHT and LEFT breast ultrasound if deemed necessary) in June/July 2026. Patient is due for contralateral screening at this point in time.   Recommend consideration of supplemental screening with breast MRI with and without contrast given history of malignancy with  dense breast tissue. This would be due in June 2026.   I have discussed the findings and recommendations with the patient. If applicable, a reminder letter will be sent to the patient regarding the next appointment.   BI-RADS CATEGORY  2: Benign.  Assessment/Plan:  Patient status post right lumpectomy for invasive ductal carcinoma.  She is completed radiation therapy and is on hormonal therapy.  She is overall doing well.  Mammogram was benign.  Will plan for repeat diagnostic bilateral mammogram in 6 months.  I will see her back then for physical exam.  If that looks good we can go to yearly exams  Jayson Endow, M.D. [facility]     [1] No Known Allergies

## 2024-05-31 NOTE — Patient Instructions (Addendum)
 The patient has been asked to return to the office in one year with a bilateral diagnostic mammogram. We will send you a letter about these appointments.    Continue self breast exams. Call office for any new breast issues or concerns.    How to Do a Breast Self-Exam Doing breast self-exams can help you stay healthy. They're one way to know what's normal for your breasts. They can help you catch a problem while it's still small and can be treated. You need to: Check your breasts often. Tell your doctor about any changes. You should do breast self-exams even if you have breast implants. What you need: A mirror. A well-lit room. A pillow or other soft object. How to do a breast self-exam Look for changes  Take off all the clothes above your waist. Stand in front of a mirror in a room with good lighting. Put your hands down at your sides. Compare your breasts in the mirror. Look for difference between them, such as: Differences in shape. Differences in size. Wrinkles, dips, and bumps in one breast and not the other. Look at each breast for skin changes, such as: Redness. Scaly spots. Spots where your skin is thicker. Dimpling. Open sores. Look for changes in your nipples, such as: Fluid coming out of a nipple. Fluid around a nipple. Bleeding. Dimpling. Redness. A nipple that looks pushed in or that has changed position. Feel for changes Lie on your back. Feel each breast. To do this: Pick a breast to feel. Place a pillow under the shoulder closest to that breast. Put the arm closest to that breast behind your head. Feel the breast using the hand of your other arm. Use the pads of your three middle fingers to make small circles starting near the nipple. Use light, medium, and firm pressure. Keep making circles, moving down over the breast. Stop when you feel your ribs. Start making circles with your fingers again, this time going up until you reach your collarbone. Then,  make circles out across your breast and into your armpit area. Squeeze your nipple. Check for fluid and lumps. Do these steps again to check your other breast. Sit or stand in the tub or shower. With soapy water on your skin, feel each breast the same way you did when you were lying down. Write down what you find Writing down what you find can help you keep track of what you want to tell your doctor. Write down: What's normal for each breast. Any changes you find. Write down: The kind of change. If your breast feels tender or painful. Any lump you find. Write down its size and where it is. When you last had your period. General tips If you're breastfeeding, the best time to check your breasts is after you feed your baby or after you use a breast pump. If you get a period, the best time to check your breasts is 5-7 days after your period ends. With time, you'll get more used to doing the self-exam. You'll also start to know if there are changes in your breasts. Contact a doctor if: You see a change in the shape or size of your breasts or nipples. You see a change in the skin of your breast or nipples. You have fluid coming from your nipples that isn't normal. You find a new lump or thick area. You have breast pain. You have any concerns about your breast health. This information is not intended to replace advice given  to you by your health care provider. Make sure you discuss any questions you have with your health care provider. Document Revised: 07/01/2023 Document Reviewed: 07/01/2023 Elsevier Patient Education  2025 [employer].

## 2024-06-01 ENCOUNTER — Encounter: Admitting: Dermatology

## 2024-06-28 ENCOUNTER — Ambulatory Visit: Admitting: Dermatology

## 2024-08-08 ENCOUNTER — Inpatient Hospital Stay: Admitting: *Deleted

## 2024-08-17 ENCOUNTER — Ambulatory Visit

## 2024-09-22 ENCOUNTER — Ambulatory Visit: Payer: Self-pay | Admitting: Radiation Oncology

## 2024-11-10 ENCOUNTER — Inpatient Hospital Stay: Admitting: Internal Medicine
